# Patient Record
Sex: Male | Born: 1975 | Race: White | Hispanic: No | Marital: Married | Smoking: Never smoker
Health system: Southern US, Community
[De-identification: ages and names within clinical notes are randomized; demographics above are authoritative.]

## PROBLEM LIST (undated history)

## (undated) DIAGNOSIS — F419 Anxiety disorder, unspecified: Secondary | ICD-10-CM

## (undated) DIAGNOSIS — J45909 Unspecified asthma, uncomplicated: Secondary | ICD-10-CM

## (undated) DIAGNOSIS — K219 Gastro-esophageal reflux disease without esophagitis: Secondary | ICD-10-CM

## (undated) HISTORY — DX: Unspecified asthma, uncomplicated: J45.909

## (undated) HISTORY — DX: Gastro-esophageal reflux disease without esophagitis: K21.9

## (undated) HISTORY — PX: KNEE ARTHROCENTESIS: SUR44

---
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
## 1998-02-05 ENCOUNTER — Emergency Department (HOSPITAL_COMMUNITY): Admission: EM | Admit: 1998-02-05 | Discharge: 1998-02-06 | Payer: Self-pay | Admitting: Emergency Medicine

## 1999-02-06 ENCOUNTER — Encounter: Admission: RE | Admit: 1999-02-06 | Discharge: 1999-02-06 | Payer: Self-pay | Admitting: Orthopedic Surgery

## 1999-03-05 ENCOUNTER — Emergency Department (HOSPITAL_COMMUNITY): Admission: EM | Admit: 1999-03-05 | Discharge: 1999-03-05 | Payer: Self-pay | Admitting: Emergency Medicine

## 1999-03-05 ENCOUNTER — Encounter: Payer: Self-pay | Admitting: Emergency Medicine

## 2001-08-09 ENCOUNTER — Emergency Department (HOSPITAL_COMMUNITY): Admission: EM | Admit: 2001-08-09 | Discharge: 2001-08-09 | Payer: Self-pay | Admitting: Emergency Medicine

## 2001-10-27 ENCOUNTER — Ambulatory Visit (HOSPITAL_BASED_OUTPATIENT_CLINIC_OR_DEPARTMENT_OTHER): Admission: RE | Admit: 2001-10-27 | Discharge: 2001-10-28 | Payer: Self-pay | Admitting: Orthopedic Surgery

## 2003-04-24 HISTORY — PX: REPLACEMENT TOTAL KNEE: SUR1224

## 2004-04-11 ENCOUNTER — Ambulatory Visit (HOSPITAL_COMMUNITY): Admission: RE | Admit: 2004-04-11 | Discharge: 2004-04-11 | Payer: Self-pay | Admitting: Gastroenterology

## 2004-05-09 ENCOUNTER — Ambulatory Visit (HOSPITAL_COMMUNITY): Admission: RE | Admit: 2004-05-09 | Discharge: 2004-05-09 | Payer: Self-pay | Admitting: Gastroenterology

## 2004-08-09 ENCOUNTER — Inpatient Hospital Stay (HOSPITAL_COMMUNITY): Admission: RE | Admit: 2004-08-09 | Discharge: 2004-08-11 | Payer: Self-pay | Admitting: Surgery

## 2006-04-23 HISTORY — PX: NISSEN FUNDOPLICATION: SHX2091

## 2006-06-15 ENCOUNTER — Emergency Department (HOSPITAL_COMMUNITY): Admission: EM | Admit: 2006-06-15 | Discharge: 2006-06-16 | Payer: Self-pay | Admitting: Emergency Medicine

## 2006-06-21 ENCOUNTER — Ambulatory Visit (HOSPITAL_COMMUNITY): Admission: RE | Admit: 2006-06-21 | Discharge: 2006-06-21 | Payer: Self-pay | Admitting: Surgery

## 2006-07-10 ENCOUNTER — Ambulatory Visit: Payer: Self-pay | Admitting: Family Medicine

## 2009-03-30 ENCOUNTER — Encounter: Admission: RE | Admit: 2009-03-30 | Discharge: 2009-03-30 | Payer: Self-pay | Admitting: Surgery

## 2009-11-09 ENCOUNTER — Inpatient Hospital Stay (HOSPITAL_COMMUNITY): Admission: RE | Admit: 2009-11-09 | Discharge: 2009-11-11 | Payer: Self-pay | Admitting: Surgery

## 2010-02-20 ENCOUNTER — Ambulatory Visit (HOSPITAL_COMMUNITY): Admission: RE | Admit: 2010-02-20 | Discharge: 2010-02-20 | Payer: Self-pay | Admitting: Surgery

## 2010-03-24 ENCOUNTER — Encounter: Admission: RE | Admit: 2010-03-24 | Discharge: 2010-03-24 | Payer: Self-pay | Admitting: Otolaryngology

## 2010-05-14 ENCOUNTER — Encounter: Payer: Self-pay | Admitting: Surgery

## 2010-05-14 ENCOUNTER — Encounter: Payer: Self-pay | Admitting: Emergency Medicine

## 2010-07-08 LAB — CBC
HCT: 33.6 % — ABNORMAL LOW (ref 39.0–52.0)
HCT: 34.3 % — ABNORMAL LOW (ref 39.0–52.0)
Hemoglobin: 11 g/dL — ABNORMAL LOW (ref 13.0–17.0)
Hemoglobin: 11.2 g/dL — ABNORMAL LOW (ref 13.0–17.0)
MCH: 26 pg (ref 26.0–34.0)
MCHC: 32.8 g/dL (ref 30.0–36.0)
MCV: 79.2 fL (ref 78.0–100.0)
RBC: 4.24 MIL/uL (ref 4.22–5.81)
RDW: 16.4 % — ABNORMAL HIGH (ref 11.5–15.5)
WBC: 8.5 10*3/uL (ref 4.0–10.5)

## 2010-07-08 LAB — DIFFERENTIAL
Basophils Absolute: 0.1 10*3/uL (ref 0.0–0.1)
Basophils Relative: 1 % (ref 0–1)
Lymphs Abs: 2.1 10*3/uL (ref 0.7–4.0)
Monocytes Absolute: 1.6 10*3/uL — ABNORMAL HIGH (ref 0.1–1.0)
Monocytes Relative: 9 % (ref 3–12)
Neutrophils Relative %: 79 % — ABNORMAL HIGH (ref 43–77)

## 2010-07-09 LAB — SURGICAL PCR SCREEN
MRSA, PCR: NEGATIVE
Staphylococcus aureus: POSITIVE — AB

## 2010-08-31 ENCOUNTER — Other Ambulatory Visit: Payer: Self-pay | Admitting: Physical Medicine and Rehabilitation

## 2010-08-31 DIAGNOSIS — M545 Low back pain: Secondary | ICD-10-CM

## 2010-09-04 ENCOUNTER — Ambulatory Visit
Admission: RE | Admit: 2010-09-04 | Discharge: 2010-09-04 | Disposition: A | Discharge status: Home / Self Care | Payer: BC Managed Care – PPO | Source: Ambulatory Visit | Attending: Physical Medicine and Rehabilitation | Admitting: Physical Medicine and Rehabilitation

## 2010-09-04 DIAGNOSIS — M545 Low back pain: Secondary | ICD-10-CM

## 2010-09-08 NOTE — Op Note (Signed)
NAMEABDULAI, Chad Singh             ACCOUNT NO.:  [id number]   MEDICAL RECORD NO.:  [id number]          PATIENT TYPE:  INP   LOCATION:  0001                         FACILITY:  [facility]   PHYSICIAN:  Thornton Park. Daphine Deutscher, MD  DATE OF BIRTH:  [birth year]   DATE OF PROCEDURE:  08/09/2004  DATE OF DISCHARGE:                                 OPERATIVE REPORT   CCS:  #[id number].   PREOPERATIVE DIAGNOSES:  Multiple year history of gastroesophageal reflux  disease with severe erosive esophagitis.   POSTOPERATIVE DIAGNOSES:  Multiple year history of gastroesophageal reflux  disease with severe erosive esophagitis.   PROCEDURE:  Laparoscopic Nissen fundoplication over a #50 lighted bougie  with a two-suture closure of the hiatus using well-pledgeted suture and a  three-suture wrap.   SURGEON:  Thornton Park. Daphine Deutscher, M.D.   ASSISTANT:  Lebron Conners, M.D.   ANESTHESIA:  General anesthesia.   ESTIMATED BLOOD LOSS:  Minimal.   INDICATIONS FOR PROCEDURE:  Chad Singh is a 35 year old white male  who has had pretty much an adult life long history of severe  gastroesophageal reflux disease.  He has been scoped, on proton pump  inhibitors, and found to have severe erosive esophagitis by Dr. Fayrene Fearing L.  Edwards.   DESCRIPTION OF PROCEDURE:  He was brought to operating room #1 and given  general anesthesia.  The abdomen was prepped with Betadine and draped  sterilely.  I entered the abdomen using the OptiVu technique in the left  upper quadrant without difficulty.  The abdomen was then insufflated and a  10-11 was placed lateral to the left of the umbilicus and two 10-11's were  placed in the right upper quadrant.  I then surveyed the abdomen and placed  a 5 mm in the upper port for the squiggly articulating retractor, by which I  retracted the left lateral segment.   Dissection began by taking down the pars flaccida with the harmonic scalpel,  and then taking this up anteriorly, taking down the  peritoneal reflection  off the distal esophagus.  I then carried this down to the left side,  freeing up and identifying the left crease.  Then we posteriorly created a  window and were able to see the left crease as well as the right crease.  I  got beneath the posterior vagus and created a window.   I then went over and took down short gastrics with the harmonic scalpel and  then really nicely mobilized the cardia and a portion of the fundus.  I  could then reach around behind the esophagus through the window and pull the  stomach around easily.  With this arrangement, I went ahead and placed a  pledgeted suture posteriorly to approximate the crura, and then a second one  I placed and then tied after Dr. Hezzie Bump. Rose had passed the lighted #50  bougie.  This was tied and secured.   Next, I then pulled back the lighted bougie and went in and grasped the  posterior fundus and brought it around and created a nice invagination of  the distal esophagus with  this portion of the stomach.  The lighted bougie  was then passed back in, so that we were wrapping over the #50 bougie.  The  upper-most suture was a #2-0 silk placed with the free needle, getting  purchases of esophagus nicely, and then tying it with a tie knot.  With this  in good position, I then followed with two endo-stitch placed sutures with  intra-corporeal ties and clips placed on the knots for identification of the  wrap location.  The wrap looked good and healthy at the end.  No bleeding  was noted from the spleen, and I surveyed the other organs, then the bowel  and everything was intact.  The abdominal cavity was deflated and the port  sites were injected with 0.5% Marcaine.  The wounds were closed with #4-0  Vicryl subcutaneously and subcuticularly, along with Benzoin and Steri-  Strips.   The patient seemed to tolerate the procedure well and was taken to the  recovery room in satisfactory condition.      MBM/MEDQ   D:  08/09/2004  T:  08/09/2004  Job:  [id number]   cc:   Fayrene Fearing L. Malon Kindle., M.D.  [address]  [place]  [place] [address]  Fax: [phone]   Merrily Brittle, M.D.  [place], [place]
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
## 2010-09-08 NOTE — Op Note (Signed)
NAME:  SALMAAN, PATCHIN             ACCOUNT NO.:  [id number]   MEDICAL RECORD NO.:  [id number]          PATIENT TYPE:  AMB   LOCATION:  ENDO                         FACILITY:  [facility]   PHYSICIAN:  James L. Malon Kindle., M.D.DATE OF BIRTH:  July 03, 1975   DATE OF PROCEDURE:  04/11/2004  DATE OF DISCHARGE:                                 OPERATIVE REPORT   PROCEDURE:  Esophagogastroduodenoscopy.   MEDICATIONS:  Cetacaine spray, fentanyl 100 mcg, Versed 10 mg IV.   INDICATIONS FOR PROCEDURE:  Persistent esophageal reflux despite b.i.d.  Nexium.   DESCRIPTION OF PROCEDURE:  The procedure had been explained to the patient  and consent obtained.  With the patient in the left lateral decubitus  position, the Olympus scope was inserted and advanced.  The stomach was  entered, pylorus identified and passed.  The scope was withdrawn. The antrum  and body of the stomach were normal.  The scope was withdrawn and the  diaphragm was seen at 40 cm, at 35 cm was the Z line.  There was no gross  appearance but  marked ulcerative esophagitis, grade 3 or 4 ulcerative  esophagitis. The proximal esophagus was normal, there were no strictures.  The GE junction was widely patent.  The scope was withdrawn. The proximal  esophagus was normal.  The patient tolerated the procedure well.   ASSESSMENT:  1.  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 This is occuring despite the use of b.i.d. proton pump      inhibitor.  At this point in time, I think he ought to consider surgery.  2.  Esophageal reflux, 530.81.   PLAN:  Will keep her on the same medicines for now.  Will arrange an  outpatient manometry and will arrange a surgical consultation.      JLE/MEDQ  D:  04/11/2004  T:  04/11/2004  Job:  [id number]   cc:   Dr. Annabelle Harman __________, Rondall Allegra, [place]

## 2010-09-08 NOTE — Op Note (Signed)
NAME:  Chad Singh, Chad Singh             ACCOUNT NO.:  [id number]   MEDICAL RECORD NO.:  [id number]          PATIENT TYPE:  AMB   LOCATION:  ENDO                         FACILITY:  [facility]   PHYSICIAN:  James L. Malon Kindle., M.D.DATE OF BIRTH:  [birth year]   DATE OF PROCEDURE:  05/09/2004  DATE OF DISCHARGE:  05/09/2004                                 OPERATIVE REPORT   PROCEDURE:  Esophageal manometry.   INDICATIONS:  Patient with persistent esophageal reflux symptoms, is being  considered for antireflux procedure.   DESCRIPTION OF PROCEDURE:  The procedure had been explained to the patient  and consent obtained.  The patient had esophageal manometry performed in the  usual manner.  The results are as follows:   1.  Upper esophageal sphincter:  Very poorly-seen.  Does not appear to have      pharyngeal spikes.  2.  Esophageal body:  Decreased pressure throughout with normal peristalsis      in most swallows.  Mean pressure of the distal esophagus is 16 mm, and      all of the pressures measured throughout the entire esophagus were      normal than the normal range, but again the peristalsis appeared normal      in most swallows.  3.  Lower esophageal sphincter:  Mean pressure 4.4 mm.  On the tracings I      really could not even determine where the LES was.  There was 100%      relaxation.   ASSESSMENT:  Hypotensive lower esophageal sphincter consistent with the  patient's history of severe esophageal reflux.   PLAN:  The patient is due to see Molli Hazard B. Daphine Deutscher, M.D., in the very near  future for consideration for an antireflux operation.      JLE/MEDQ  D:  05/22/2004  T:  05/22/2004  Job:  [id number]   cc:   Brandt Loosen, M.D.  [place], [place] B. Daphine Deutscher, MD  [address], [address]  [place]  [place] [address]

## 2012-09-03 ENCOUNTER — Telehealth (INDEPENDENT_AMBULATORY_CARE_PROVIDER_SITE_OTHER): Payer: Self-pay | Admitting: General Surgery
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
## 2013-10-30 ENCOUNTER — Encounter (INDEPENDENT_AMBULATORY_CARE_PROVIDER_SITE_OTHER): Payer: BC Managed Care – PPO | Admitting: Surgery

## 2013-11-03 ENCOUNTER — Ambulatory Visit (INDEPENDENT_AMBULATORY_CARE_PROVIDER_SITE_OTHER): Payer: Self-pay | Admitting: Surgery

## 2013-11-03 ENCOUNTER — Encounter (INDEPENDENT_AMBULATORY_CARE_PROVIDER_SITE_OTHER): Payer: Self-pay | Admitting: Surgery

## 2013-11-03 VITALS — BP 128/82 | HR 84 | Temp 97.2°F | Resp 16 | Ht 72.0 in | Wt 203.0 lb

## 2013-11-03 DIAGNOSIS — R0789 Other chest pain: Secondary | ICD-10-CM

## 2013-11-03 DIAGNOSIS — R071 Chest pain on breathing: Secondary | ICD-10-CM

## 2013-11-03 NOTE — Patient Instructions (Signed)
Thanks for your patience.  If you need further assistance after leaving the office, please call our office and speak with a [facility] nurse.  [phone].  If you want to leave a message for Dr. Larah Kuntzman, please call his office phone at [phone]. 

## 2013-11-03 NOTE — Progress Notes (Signed)
Chad Singh 37 y.o.  Body mass index is 27.53 kg/(m^2).  There are no active problems to display for this patient.   No Known Allergies    Past Surgical History  Procedure Laterality Date  . Knee arthrocentesis    . Nissen fundoplication  2008   No primary provider on file. 1. Chest wall pain     Chad Singh comes in today to assess chest pain after straining.  He describes trying to pull out a stuck spool and did so by placing his foot up on his truck and grabbing the spool when pulling back with 2 arms. When he did that he experienced sharp pain in the left chest with swelling anteriorly. He has not experienced any increase and he can of GI symptoms. The tenderness and then the left anterior chest wall where the rectus muscles insert on the rib cage. His swallowing is unchanged he really doesn't have much in the way of heartburn symptoms. He watches what he eats and is able to control things.  On physical exam he was most tender over the left anterior chest. He does have a prominent xiphoid and perhaps a little bit of  pectus excavatum.  I reassured him to watch how he lists and strains or he will aggravate his chest wall injury. I do not think he has had any diaphragmatic injury that will adversely effect his underlying Nissen fundoplication and hiatal hernia repair. Matt B. Daphine DeutscherMartin, MD, [facility]  [facility] [phone] beeper [phone]  11/03/2013 2:07 PM

## 2013-11-13 ENCOUNTER — Other Ambulatory Visit (INDEPENDENT_AMBULATORY_CARE_PROVIDER_SITE_OTHER): Payer: Self-pay | Admitting: Surgery

## 2013-11-13 DIAGNOSIS — J329 Chronic sinusitis, unspecified: Secondary | ICD-10-CM

## 2013-11-13 MED ORDER — LEVOFLOXACIN 500 MG PO TABS: 500.0000 mg | ORAL_TABLET | Freq: Every day | ORAL | Status: AC

## 2013-11-13 NOTE — Telephone Encounter (Signed)
[name] is out on a job and having severe pain with recurrent maxillary sinusitis.  Will begin Levaquin, Afrin, and Sudafed.

## 2017-07-10 ENCOUNTER — Encounter: Payer: Self-pay | Admitting: Family Medicine

## 2017-07-10 ENCOUNTER — Ambulatory Visit (INDEPENDENT_AMBULATORY_CARE_PROVIDER_SITE_OTHER): Payer: BLUE CROSS/BLUE SHIELD | Admitting: Family Medicine

## 2017-07-10 VITALS — BP 130/106 | HR 97 | Ht 72.0 in | Wt 205.0 lb

## 2017-07-10 DIAGNOSIS — Z0000 Encounter for general adult medical examination without abnormal findings: Secondary | ICD-10-CM | POA: Diagnosis not present

## 2017-07-10 DIAGNOSIS — R030 Elevated blood-pressure reading, without diagnosis of hypertension: Secondary | ICD-10-CM | POA: Insufficient documentation

## 2017-07-10 DIAGNOSIS — J309 Allergic rhinitis, unspecified: Secondary | ICD-10-CM | POA: Diagnosis not present

## 2017-07-10 DIAGNOSIS — K219 Gastro-esophageal reflux disease without esophagitis: Secondary | ICD-10-CM | POA: Diagnosis not present

## 2017-07-10 DIAGNOSIS — F419 Anxiety disorder, unspecified: Secondary | ICD-10-CM | POA: Diagnosis not present

## 2017-07-10 LAB — URINALYSIS, ROUTINE W REFLEX MICROSCOPIC
Bilirubin Urine: NEGATIVE
HGB URINE DIPSTICK: NEGATIVE
Ketones, ur: NEGATIVE
Leukocytes, UA: NEGATIVE
Nitrite: NEGATIVE
Specific Gravity, Urine: 1.025 (ref 1.000–1.030)
Total Protein, Urine: NEGATIVE
URINE GLUCOSE: NEGATIVE
UROBILINOGEN UA: 0.2 (ref 0.0–1.0)
pH: 6 (ref 5.0–8.0)

## 2017-07-10 LAB — COMPREHENSIVE METABOLIC PANEL
AG Ratio: 1.6 (calc) (ref 1.0–2.5)
ALKALINE PHOSPHATASE (APISO): 51 U/L (ref 40–115)
ALT: 27 U/L (ref 9–46)
AST: 20 U/L (ref 10–40)
Albumin: 4.5 g/dL (ref 3.6–5.1)
BUN: 10 mg/dL (ref 7–25)
CHLORIDE: 106 mmol/L (ref 98–110)
CO2: 23 mmol/L (ref 20–32)
CREATININE: 1.17 mg/dL (ref 0.60–1.35)
Calcium: 9.7 mg/dL (ref 8.6–10.3)
GLOBULIN: 2.9 g/dL (ref 1.9–3.7)
Glucose, Bld: 109 mg/dL — ABNORMAL HIGH (ref 65–99)
Potassium: 4.2 mmol/L (ref 3.5–5.3)
SODIUM: 142 mmol/L (ref 135–146)
TOTAL PROTEIN: 7.4 g/dL (ref 6.1–8.1)
Total Bilirubin: 0.6 mg/dL (ref 0.2–1.2)

## 2017-07-10 LAB — TSH: TSH: 1.18 u[IU]/mL (ref 0.35–4.50)

## 2017-07-10 LAB — CBC
HCT: 45.1 % (ref 39.0–52.0)
Hemoglobin: 15.4 g/dL (ref 13.0–17.0)
MCHC: 34.1 g/dL (ref 30.0–36.0)
MCV: 90.5 fl (ref 78.0–100.0)
PLATELETS: 306 10*3/uL (ref 150.0–400.0)
RBC: 4.99 Mil/uL (ref 4.22–5.81)
RDW: 13.8 % (ref 11.5–15.5)
WBC: 10.6 10*3/uL — AB (ref 4.0–10.5)

## 2017-07-10 NOTE — Patient Instructions (Addendum)
DASH Eating Plan DASH stands for "Dietary Approaches to Stop Hypertension." The DASH eating plan is a healthy eating plan that has been shown to reduce high blood pressure (hypertension). It may also reduce your risk for type 2 diabetes, heart disease, and stroke. The DASH eating plan may also help with weight loss. What are tips for following this plan? General guidelines  Avoid eating more than 2,300 mg (milligrams) of salt (sodium) a day. If you have hypertension, you may need to reduce your sodium intake to 1,500 mg a day.  Limit alcohol intake to no more than 1 drink a day for nonpregnant women and 2 drinks a day for men. One drink equals 12 oz of beer, 5 oz of wine, or 1 oz of hard liquor.  Work with your health care provider to maintain a healthy body weight or to lose weight. Ask what an ideal weight is for you.  Get at least 30 minutes of exercise that causes your heart to beat faster (aerobic exercise) most days of the week. Activities may include walking, swimming, or biking.  Work with your health care provider or diet and nutrition specialist (dietitian) to adjust your eating plan to your individual calorie needs. Reading food labels  Check food labels for the amount of sodium per serving. Choose foods with less than 5 percent of the Daily Value of sodium. Generally, foods with less than 300 mg of sodium per serving fit into this eating plan.  To find whole grains, look for the word "whole" as the first word in the ingredient list. Shopping  Buy products labeled as "low-sodium" or "no salt added."  Buy fresh foods. Avoid canned foods and premade or frozen meals. Cooking  Avoid adding salt when cooking. Use salt-free seasonings or herbs instead of table salt or sea salt. Check with your health care provider or pharmacist before using salt substitutes.  Do not fry foods. Cook foods using healthy methods such as baking, boiling, grilling, and broiling instead.  Cook with  heart-healthy oils, such as olive, canola, soybean, or sunflower oil. Meal planning   Eat a balanced diet that includes: ? 5 or more servings of fruits and vegetables each day. At each meal, try to fill half of your plate with fruits and vegetables. ? Up to 6-8 servings of whole grains each day. ? Less than 6 oz of lean meat, poultry, or fish each day. A 3-oz serving of meat is about the same size as a deck of cards. One egg equals 1 oz. ? 2 servings of low-fat dairy each day. ? A serving of nuts, seeds, or beans 5 times each week. ? Heart-healthy fats. Healthy fats called Omega-3 fatty acids are found in foods such as flaxseeds and coldwater fish, like sardines, salmon, and mackerel.  Limit how much you eat of the following: ? Canned or prepackaged foods. ? Food that is high in trans fat, such as fried foods. ? Food that is high in saturated fat, such as fatty meat. ? Sweets, desserts, sugary drinks, and other foods with added sugar. ? Full-fat dairy products.  Do not salt foods before eating.  Try to eat at least 2 vegetarian meals each week.  Eat more home-cooked food and less restaurant, buffet, and fast food.  When eating at a restaurant, ask that your food be prepared with less salt or no salt, if possible. What foods are recommended? The items listed may not be a complete list. Talk with your dietitian about what   dietary choices are best for you. Grains Whole-grain or whole-wheat bread. Whole-grain or whole-wheat pasta. Brown rice. Oatmeal. Quinoa. Bulgur. Whole-grain and low-sodium cereals. Pita bread. Low-fat, low-sodium crackers. Whole-wheat flour tortillas. Vegetables Fresh or frozen vegetables (raw, steamed, roasted, or grilled). Low-sodium or reduced-sodium tomato and vegetable juice. Low-sodium or reduced-sodium tomato sauce and tomato paste. Low-sodium or reduced-sodium canned vegetables. Fruits All fresh, dried, or frozen fruit. Canned fruit in natural juice (without  added sugar). Meat and other protein foods Skinless chicken or turkey. Ground chicken or turkey. Pork with fat trimmed off. Fish and seafood. Egg whites. Dried beans, peas, or lentils. Unsalted nuts, nut butters, and seeds. Unsalted canned beans. Lean cuts of beef with fat trimmed off. Low-sodium, lean deli meat. Dairy Low-fat (1%) or fat-free (skim) milk. Fat-free, low-fat, or reduced-fat cheeses. Nonfat, low-sodium ricotta or cottage cheese. Low-fat or nonfat yogurt. Low-fat, low-sodium cheese. Fats and oils Soft margarine without trans fats. Vegetable oil. Low-fat, reduced-fat, or light mayonnaise and salad dressings (reduced-sodium). Canola, safflower, olive, soybean, and sunflower oils. Avocado. Seasoning and other foods Herbs. Spices. Seasoning mixes without salt. Unsalted popcorn and pretzels. Fat-free sweets. What foods are not recommended? The items listed may not be a complete list. Talk with your dietitian about what dietary choices are best for you. Grains Baked goods made with fat, such as croissants, muffins, or some breads. Dry pasta or rice meal packs. Vegetables Creamed or fried vegetables. Vegetables in a cheese sauce. Regular canned vegetables (not low-sodium or reduced-sodium). Regular canned tomato sauce and paste (not low-sodium or reduced-sodium). Regular tomato and vegetable juice (not low-sodium or reduced-sodium). Pickles. Olives. Fruits Canned fruit in a light or heavy syrup. Fried fruit. Fruit in cream or butter sauce. Meat and other protein foods Fatty cuts of meat. Ribs. Fried meat. Bacon. Sausage. Bologna and other processed lunch meats. Salami. Fatback. Hotdogs. Bratwurst. Salted nuts and seeds. Canned beans with added salt. Canned or smoked fish. Whole eggs or egg yolks. Chicken or turkey with skin. Dairy Whole or 2% milk, cream, and half-and-half. Whole or full-fat cream cheese. Whole-fat or sweetened yogurt. Full-fat cheese. Nondairy creamers. Whipped toppings.  Processed cheese and cheese spreads. Fats and oils Butter. Stick margarine. Lard. Shortening. Ghee. Bacon fat. Tropical oils, such as coconut, palm kernel, or palm oil. Seasoning and other foods Salted popcorn and pretzels. Onion salt, garlic salt, seasoned salt, table salt, and sea salt. Worcestershire sauce. Tartar sauce. Barbecue sauce. Teriyaki sauce. Soy sauce, including reduced-sodium. Steak sauce. Canned and packaged gravies. Fish sauce. Oyster sauce. Cocktail sauce. Horseradish that you find on the shelf. Ketchup. Mustard. Meat flavorings and tenderizers. Bouillon cubes. Hot sauce and Tabasco sauce. Premade or packaged marinades. Premade or packaged taco seasonings. Relishes. Regular salad dressings. Where to find more information:  National Heart, Lung, and Blood Institute: www.nhlbi.nih.gov  American Heart Association: www.heart.org Summary  The DASH eating plan is a healthy eating plan that has been shown to reduce high blood pressure (hypertension). It may also reduce your risk for type 2 diabetes, heart disease, and stroke.  With the DASH eating plan, you should limit salt (sodium) intake to 2,300 mg a day. If you have hypertension, you may need to reduce your sodium intake to 1,500 mg a day.  When on the DASH eating plan, aim to eat more fresh fruits and vegetables, whole grains, lean proteins, low-fat dairy, and heart-healthy fats.  Work with your health care provider or diet and nutrition specialist (dietitian) to adjust your eating plan to your individual   calorie needs. This information is not intended to replace advice given to you by your health care provider. Make sure you discuss any questions you have with your health care provider. Document Released: 03/29/2011 Document Revised: 04/02/2016 Document Reviewed: 04/02/2016 Elsevier Interactive Patient Education  2018 [employer]  Hypertension Hypertension, commonly called high blood pressure, is when the force of blood  pumping through the arteries is too strong. The arteries are the blood vessels that carry blood from the heart throughout the body. Hypertension forces the heart to work harder to pump blood and may cause arteries to become narrow or stiff. Having untreated or uncontrolled hypertension can cause heart attacks, strokes, kidney disease, and other problems. A blood pressure reading consists of a higher number over a lower number. Ideally, your blood pressure should be below 120/80. The first ("top") number is called the systolic pressure. It is a measure of the pressure in your arteries as your heart beats. The second ("bottom") number is called the diastolic pressure. It is a measure of the pressure in your arteries as the heart relaxes. What are the causes? The cause of this condition is not known. What increases the risk? Some risk factors for high blood pressure are under your control. Others are not. Factors you can change  Smoking.  Having type 2 diabetes mellitus, high cholesterol, or both.  Not getting enough exercise or physical activity.  Being overweight.  Having too much fat, sugar, calories, or salt (sodium) in your diet.  Drinking too much alcohol. Factors that are difficult or impossible to change  Having chronic kidney disease.  Having a family history of high blood pressure.  Age. Risk increases with age.  Race. You may be at higher risk if you are African-American.  Gender. Men are at higher risk than women before age 45. After age 65, women are at higher risk than men.  Having obstructive sleep apnea.  Stress. What are the signs or symptoms? Extremely high blood pressure (hypertensive crisis) may cause:  Headache.  Anxiety.  Shortness of breath.  Nosebleed.  Nausea and vomiting.  Severe chest pain.  Jerky movements you cannot control (seizures).  How is this diagnosed? This condition is diagnosed by measuring your blood pressure while you are  seated, with your arm resting on a surface. The cuff of the blood pressure monitor will be placed directly against the skin of your upper arm at the level of your heart. It should be measured at least twice using the same arm. Certain conditions can cause a difference in blood pressure between your right and left arms. Certain factors can cause blood pressure readings to be lower or higher than normal (elevated) for a short period of time:  When your blood pressure is higher when you are in a health care provider's office than when you are at home, this is called white coat hypertension. Most people with this condition do not need medicines.  When your blood pressure is higher at home than when you are in a health care provider's office, this is called masked hypertension. Most people with this condition may need medicines to control blood pressure.  If you have a high blood pressure reading during one visit or you have normal blood pressure with other risk factors:  You may be asked to return on a different day to have your blood pressure checked again.  You may be asked to monitor your blood pressure at home for 1 week or longer.  If you are   diagnosed with hypertension, you may have other blood or imaging tests to help your health care provider understand your overall risk for other conditions. How is this treated? This condition is treated by making healthy lifestyle changes, such as eating healthy foods, exercising more, and reducing your alcohol intake. Your health care provider may prescribe medicine if lifestyle changes are not enough to get your blood pressure under control, and if:  Your systolic blood pressure is above 130.  Your diastolic blood pressure is above 80.  Your personal target blood pressure may vary depending on your medical conditions, your age, and other factors. Follow these instructions at home: Eating and drinking  Eat a diet that is high in fiber and potassium,  and low in sodium, added sugar, and fat. An example eating plan is called the DASH (Dietary Approaches to Stop Hypertension) diet. To eat this way: ? Eat plenty of fresh fruits and vegetables. Try to fill half of your plate at each meal with fruits and vegetables. ? Eat whole grains, such as whole wheat pasta, brown rice, or whole grain bread. Fill about one quarter of your plate with whole grains. ? Eat or drink low-fat dairy products, such as skim milk or low-fat yogurt. ? Avoid fatty cuts of meat, processed or cured meats, and poultry with skin. Fill about one quarter of your plate with lean proteins, such as fish, chicken without skin, beans, eggs, and tofu. ? Avoid premade and processed foods. These tend to be higher in sodium, added sugar, and fat.  Reduce your daily sodium intake. Most people with hypertension should eat less than 1,500 mg of sodium a day.  Limit alcohol intake to no more than 1 drink a day for nonpregnant women and 2 drinks a day for men. One drink equals 12 oz of beer, 5 oz of wine, or 1 oz of hard liquor. Lifestyle  Work with your health care provider to maintain a healthy body weight or to lose weight. Ask what an ideal weight is for you.  Get at least 30 minutes of exercise that causes your heart to beat faster (aerobic exercise) most days of the week. Activities may include walking, swimming, or biking.  Include exercise to strengthen your muscles (resistance exercise), such as pilates or lifting weights, as part of your weekly exercise routine. Try to do these types of exercises for 30 minutes at least 3 days a week.  Do not use any products that contain nicotine or tobacco, such as cigarettes and e-cigarettes. If you need help quitting, ask your health care provider.  Monitor your blood pressure at home as told by your health care provider.  Keep all follow-up visits as told by your health care provider. This is important. Medicines  Take over-the-counter and  prescription medicines only as told by your health care provider. Follow directions carefully. Blood pressure medicines must be taken as prescribed.  Do not skip doses of blood pressure medicine. Doing this puts you at risk for problems and can make the medicine less effective.  Ask your health care provider about side effects or reactions to medicines that you should watch for. Contact a health care provider if:  You think you are having a reaction to a medicine you are taking.  You have headaches that keep coming back (recurring).  You feel dizzy.  You have swelling in your ankles.  You have trouble with your vision. Get help right away if:  You develop a severe headache or confusion.    You have unusual weakness or numbness.  You feel faint.  You have severe pain in your chest or abdomen.  You vomit repeatedly.  You have trouble breathing. Summary  Hypertension is when the force of blood pumping through your arteries is too strong. If this condition is not controlled, it may put you at risk for serious complications.  Your personal target blood pressure may vary depending on your medical conditions, your age, and other factors. For most people, a normal blood pressure is less than 120/80.  Hypertension is treated with lifestyle changes, medicines, or a combination of both. Lifestyle changes include weight loss, eating a healthy, low-sodium diet, exercising more, and limiting alcohol. This information is not intended to replace advice given to you by your health care provider. Make sure you discuss any questions you have with your health care provider. Document Released: 04/09/2005 Document Revised: 03/07/2016 Document Reviewed: 03/07/2016 Elsevier Interactive Patient Education  2018 [employer] Inc.  

## 2017-07-10 NOTE — Progress Notes (Signed)
Subjective:  Patient ID: Chad RossettiRobert D Singh, male    DOB: 1975-09-19  Age: 42 y.o. MRN: [id number]  CC: Establish Care   HPI Chad RossettiRobert D Withington presents for establishment.  He is by his wife.  He is fasting today.  He has a significant past medical history of allergy rhinitis treated with Singulair.  He is a history of of GERD that has been treated surgically with a Nissen fundoplication.  Past medical history also includes anxiety.  He has been followed for this at a psychiatric practice for some time now and has decided to seek his psychiatric care elsewhere.  His blood pressure has elevated over the last year or 2.  It is never been treated.  He does not smoke or use illicit drugs.  He consumes some alcohol on the weekends but denies daily use.  His wife concurs.  They have 2 sons ages 6910 and 65.  Sons are involved with soccer.  His parents are both in their 960s and dad has high blood pressure.  Mom does not have high blood pressure as far as they know.   History Chad MaduroRobert has a past medical history of Asthma and GERD (gastroesophageal reflux disease).   He has a past surgical history that includes Knee arthrocentesis and Nissen fundoplication (2008).   His family history is not on file.He reports that  has never smoked. he has never used smokeless tobacco. He reports that he drinks alcohol. He reports that he does not use drugs.  Outpatient Medications Prior to Visit  Medication Sig Dispense Refill  . cetirizine (ZYRTEC) 10 MG tablet Take 10 mg by mouth.    . montelukast (SINGULAIR) 10 MG tablet TAKE 1 TABLET BY MOUTH DAILY     No facility-administered medications prior to visit.     ROS Review of Systems  Constitutional: Negative for chills, fatigue, fever and unexpected weight change.  HENT: Negative.   Eyes: Negative.   Respiratory: Negative.   Cardiovascular: Negative.   Genitourinary: Negative.   Musculoskeletal: Negative for gait problem and myalgias.  Skin: Negative.     Neurological: Negative for weakness and headaches.  Hematological: Does not bruise/bleed easily.  Psychiatric/Behavioral: The patient is nervous/anxious.     Objective:  BP (!) 130/106 (BP Location: Left Arm, Patient Position: Sitting, Cuff Size: Normal)   Pulse 97   Ht 6' (1.829 m)   Wt 205 lb (93 kg)   BMI 27.80 kg/m   Physical Exam  Constitutional: He is oriented to person, place, and time. He appears well-developed and well-nourished. No distress.  HENT:  Head: Normocephalic and atraumatic.  Right Ear: External ear normal.  Left Ear: External ear normal.  Mouth/Throat: Oropharynx is clear and moist. No oropharyngeal exudate.  Eyes: Pupils are equal, round, and reactive to light. Right eye exhibits no discharge. Left eye exhibits no discharge. No scleral icterus.  Neck: Neck supple. No JVD present. No tracheal deviation present. No thyromegaly present.  Cardiovascular: Normal rate, regular rhythm and normal heart sounds.  Pulmonary/Chest: Effort normal and breath sounds normal. No stridor.  Abdominal: Soft. Bowel sounds are normal. He exhibits no distension and no mass. There is no tenderness. There is no rebound and no guarding. Hernia confirmed negative in the right inguinal area and confirmed negative in the left inguinal area.  Genitourinary: Penis normal. Right testis shows no mass, no swelling and no tenderness. Right testis is descended. Left testis shows no mass, no swelling and no tenderness. Left testis is descended. No  phimosis, paraphimosis, hypospadias, penile erythema or penile tenderness. No discharge found.  Lymphadenopathy:    He has no cervical adenopathy.       Right: No inguinal adenopathy present.       Left: No inguinal adenopathy present.  Neurological: He is alert and oriented to person, place, and time.  Skin: Skin is warm and dry. He is not diaphoretic.  Psychiatric: He has a normal mood and affect. His behavior is normal.      Assessment & Plan:    [place] was seen today for establish care.  Diagnoses and all orders for this visit:  Elevated BP without diagnosis of hypertension -     CBC -     Comprehensive metabolic panel -     TSH -     Urinalysis, Routine w reflex microscopic  Health care maintenance -     CBC -     Comprehensive metabolic panel -     Lipid panel -     TSH -     Urinalysis, Routine w reflex microscopic  Allergic rhinitis, unspecified seasonality, unspecified trigger  Gastroesophageal reflux disease, esophagitis presence not specified  Anxiety -     Ambulatory referral to Psychiatry   I am having Barbette Hair. Elting maintain his cetirizine and montelukast.  No orders of the defined types were placed in this encounter.  His wife will check and record his blood pressure over the next month.  He will RTC after that period of time and will make a decision about his blood pressure were whether or not it needs treatment.  Follow-up: Return in about 1 month (around 08/10/2017).  Mliss Sax, MD

## 2017-07-11 ENCOUNTER — Encounter: Payer: Self-pay | Admitting: Family Medicine

## 2017-07-11 LAB — LIPID PANEL
CHOL/HDL RATIO: 3
Cholesterol: 185 mg/dL (ref 0–200)
HDL: 55 mg/dL (ref >39.00)
LDL CALC: 108 mg/dL — AB (ref 0–99)
NonHDL: 130.27
Triglycerides: 111 mg/dL (ref 0.0–149.0)
VLDL: 22.2 mg/dL (ref 0.0–40.0)

## 2017-07-18 ENCOUNTER — Ambulatory Visit (INDEPENDENT_AMBULATORY_CARE_PROVIDER_SITE_OTHER): Payer: PRIVATE HEALTH INSURANCE | Admitting: Psychiatry

## 2017-07-18 ENCOUNTER — Encounter (HOSPITAL_COMMUNITY): Payer: Self-pay | Admitting: Psychiatry

## 2017-07-18 VITALS — BP 142/80 | HR 72 | Ht 72.0 in | Wt 203.8 lb

## 2017-07-18 DIAGNOSIS — F900 Attention-deficit hyperactivity disorder, predominantly inattentive type: Secondary | ICD-10-CM | POA: Diagnosis not present

## 2017-07-18 DIAGNOSIS — Z818 Family history of other mental and behavioral disorders: Secondary | ICD-10-CM | POA: Diagnosis not present

## 2017-07-18 DIAGNOSIS — F411 Generalized anxiety disorder: Secondary | ICD-10-CM | POA: Diagnosis not present

## 2017-07-18 MED ORDER — LAMOTRIGINE 25 MG PO TABS: ORAL_TABLET | ORAL | 0 refills | Status: DC

## 2017-07-18 MED ORDER — SERTRALINE HCL 50 MG PO TABS: ORAL_TABLET | ORAL | 0 refills | Status: DC

## 2017-07-18 MED ORDER — LISDEXAMFETAMINE DIMESYLATE 30 MG PO CAPS: 30.0000 mg | ORAL_CAPSULE | Freq: Every day | ORAL | 0 refills | Status: DC
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
## 2017-08-10 ENCOUNTER — Other Ambulatory Visit (HOSPITAL_COMMUNITY): Payer: Self-pay | Admitting: Psychiatry

## 2017-08-10 DIAGNOSIS — F411 Generalized anxiety disorder: Secondary | ICD-10-CM

## 2017-08-12 ENCOUNTER — Ambulatory Visit (INDEPENDENT_AMBULATORY_CARE_PROVIDER_SITE_OTHER): Payer: BLUE CROSS/BLUE SHIELD | Admitting: Family Medicine

## 2017-08-12 ENCOUNTER — Encounter: Payer: Self-pay | Admitting: Family Medicine

## 2017-08-12 VITALS — BP 142/90 | HR 109 | Ht 72.0 in | Wt 201.4 lb

## 2017-08-12 DIAGNOSIS — I10 Essential (primary) hypertension: Secondary | ICD-10-CM | POA: Insufficient documentation

## 2017-08-12 MED ORDER — DILTIAZEM HCL ER 120 MG PO CP24: 120.0000 mg | ORAL_CAPSULE | Freq: Every day | ORAL | 1 refills | Status: DC
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
## 2017-08-12 NOTE — Patient Instructions (Signed)
DASH Eating Plan DASH stands for "Dietary Approaches to Stop Hypertension." The DASH eating plan is a healthy eating plan that has been shown to reduce high blood pressure (hypertension). It may also reduce your risk for type 2 diabetes, heart disease, and stroke. The DASH eating plan may also help with weight loss. What are tips for following this plan? General guidelines  Avoid eating more than 2,300 mg (milligrams) of salt (sodium) a day. If you have hypertension, you may need to reduce your sodium intake to 1,500 mg a day.  Limit alcohol intake to no more than 1 drink a day for nonpregnant women and 2 drinks a day for men. One drink equals 12 oz of beer, 5 oz of wine, or 1 oz of hard liquor.  Work with your health care provider to maintain a healthy body weight or to lose weight. Ask what an ideal weight is for you.  Get at least 30 minutes of exercise that causes your heart to beat faster (aerobic exercise) most days of the week. Activities may include walking, swimming, or biking.  Work with your health care provider or diet and nutrition specialist (dietitian) to adjust your eating plan to your individual calorie needs. Reading food labels  Check food labels for the amount of sodium per serving. Choose foods with less than 5 percent of the Daily Value of sodium. Generally, foods with less than 300 mg of sodium per serving fit into this eating plan.  To find whole grains, look for the word "whole" as the first word in the ingredient list. Shopping  Buy products labeled as "low-sodium" or "no salt added."  Buy fresh foods. Avoid canned foods and premade or frozen meals. Cooking  Avoid adding salt when cooking. Use salt-free seasonings or herbs instead of table salt or sea salt. Check with your health care provider or pharmacist before using salt substitutes.  Do not fry foods. Cook foods using healthy methods such as baking, boiling, grilling, and broiling instead.  Cook with  heart-healthy oils, such as olive, canola, soybean, or sunflower oil. Meal planning   Eat a balanced diet that includes: ? 5 or more servings of fruits and vegetables each day. At each meal, try to fill half of your plate with fruits and vegetables. ? Up to 6-8 servings of whole grains each day. ? Less than 6 oz of lean meat, poultry, or fish each day. A 3-oz serving of meat is about the same size as a deck of cards. One egg equals 1 oz. ? 2 servings of low-fat dairy each day. ? A serving of nuts, seeds, or beans 5 times each week. ? Heart-healthy fats. Healthy fats called Omega-3 fatty acids are found in foods such as flaxseeds and coldwater fish, like sardines, salmon, and mackerel.  Limit how much you eat of the following: ? Canned or prepackaged foods. ? Food that is high in trans fat, such as fried foods. ? Food that is high in saturated fat, such as fatty meat. ? Sweets, desserts, sugary drinks, and other foods with added sugar. ? Full-fat dairy products.  Do not salt foods before eating.  Try to eat at least 2 vegetarian meals each week.  Eat more home-cooked food and less restaurant, buffet, and fast food.  When eating at a restaurant, ask that your food be prepared with less salt or no salt, if possible. What foods are recommended? The items listed may not be a complete list. Talk with your dietitian about what   dietary choices are best for you. Grains Whole-grain or whole-wheat bread. Whole-grain or whole-wheat pasta. Brown rice. Oatmeal. Quinoa. Bulgur. Whole-grain and low-sodium cereals. Pita bread. Low-fat, low-sodium crackers. Whole-wheat flour tortillas. Vegetables Fresh or frozen vegetables (raw, steamed, roasted, or grilled). Low-sodium or reduced-sodium tomato and vegetable juice. Low-sodium or reduced-sodium tomato sauce and tomato paste. Low-sodium or reduced-sodium canned vegetables. Fruits All fresh, dried, or frozen fruit. Canned fruit in natural juice (without  added sugar). Meat and other protein foods Skinless chicken or turkey. Ground chicken or turkey. Pork with fat trimmed off. Fish and seafood. Egg whites. Dried beans, peas, or lentils. Unsalted nuts, nut butters, and seeds. Unsalted canned beans. Lean cuts of beef with fat trimmed off. Low-sodium, lean deli meat. Dairy Low-fat (1%) or fat-free (skim) milk. Fat-free, low-fat, or reduced-fat cheeses. Nonfat, low-sodium ricotta or cottage cheese. Low-fat or nonfat yogurt. Low-fat, low-sodium cheese. Fats and oils Soft margarine without trans fats. Vegetable oil. Low-fat, reduced-fat, or light mayonnaise and salad dressings (reduced-sodium). Canola, safflower, olive, soybean, and sunflower oils. Avocado. Seasoning and other foods Herbs. Spices. Seasoning mixes without salt. Unsalted popcorn and pretzels. Fat-free sweets. What foods are not recommended? The items listed may not be a complete list. Talk with your dietitian about what dietary choices are best for you. Grains Baked goods made with fat, such as croissants, muffins, or some breads. Dry pasta or rice meal packs. Vegetables Creamed or fried vegetables. Vegetables in a cheese sauce. Regular canned vegetables (not low-sodium or reduced-sodium). Regular canned tomato sauce and paste (not low-sodium or reduced-sodium). Regular tomato and vegetable juice (not low-sodium or reduced-sodium). Pickles. Olives. Fruits Canned fruit in a light or heavy syrup. Fried fruit. Fruit in cream or butter sauce. Meat and other protein foods Fatty cuts of meat. Ribs. Fried meat. Bacon. Sausage. Bologna and other processed lunch meats. Salami. Fatback. Hotdogs. Bratwurst. Salted nuts and seeds. Canned beans with added salt. Canned or smoked fish. Whole eggs or egg yolks. Chicken or turkey with skin. Dairy Whole or 2% milk, cream, and half-and-half. Whole or full-fat cream cheese. Whole-fat or sweetened yogurt. Full-fat cheese. Nondairy creamers. Whipped toppings.  Processed cheese and cheese spreads. Fats and oils Butter. Stick margarine. Lard. Shortening. Ghee. Bacon fat. Tropical oils, such as coconut, palm kernel, or palm oil. Seasoning and other foods Salted popcorn and pretzels. Onion salt, garlic salt, seasoned salt, table salt, and sea salt. Worcestershire sauce. Tartar sauce. Barbecue sauce. Teriyaki sauce. Soy sauce, including reduced-sodium. Steak sauce. Canned and packaged gravies. Fish sauce. Oyster sauce. Cocktail sauce. Horseradish that you find on the shelf. Ketchup. Mustard. Meat flavorings and tenderizers. Bouillon cubes. Hot sauce and Tabasco sauce. Premade or packaged marinades. Premade or packaged taco seasonings. Relishes. Regular salad dressings. Where to find more information:  National Heart, Lung, and Blood Institute: www.nhlbi.nih.gov  American Heart Association: www.heart.org Summary  The DASH eating plan is a healthy eating plan that has been shown to reduce high blood pressure (hypertension). It may also reduce your risk for type 2 diabetes, heart disease, and stroke.  With the DASH eating plan, you should limit salt (sodium) intake to 2,300 mg a day. If you have hypertension, you may need to reduce your sodium intake to 1,500 mg a day.  When on the DASH eating plan, aim to eat more fresh fruits and vegetables, whole grains, lean proteins, low-fat dairy, and heart-healthy fats.  Work with your health care provider or diet and nutrition specialist (dietitian) to adjust your eating plan to your individual   calorie needs. This information is not intended to replace advice given to you by your health care provider. Make sure you discuss any questions you have with your health care provider. Document Released: 03/29/2011 Document Revised: 04/02/2016 Document Reviewed: 04/02/2016 Elsevier Interactive Patient Education  2018 [employer] Diltiazem extended-release capsules or tablets What is this medicine? DILTIAZEM (dil TYE a  zem) is a calcium-channel blocker. It affects the amount of calcium found in your heart and muscle cells. This relaxes your blood vessels, which can reduce the amount of work the heart has to do. This medicine is used to treat high blood pressure and chest pain caused by angina. This medicine may be used for other purposes; ask your health care provider or pharmacist if you have questions. COMMON BRAND NAME(S): Cardizem CD, Cardizem LA, Cardizem SR, Cartia XT, Dilacor XR, Dilt-CD, Diltia XT, Diltzac, Matzim LA, Verna Czechaztia XT, Tiamate, Tiazac What should I tell my health care provider before I take this medicine? They need to know if you have any of these conditions: -heart problems, low blood pressure, irregular heartbeat -liver disease -previous heart attack -an unusual or allergic reaction to diltiazem, other medicines, foods, dyes, or preservatives -pregnant or trying to get pregnant -breast-feeding How should I use this medicine? Take this medicine by mouth with a glass of water. Follow the directions on the prescription label. Swallow whole, do not crush or chew. Ask your doctor or pharmacist if your should take this medicine with food. Take your doses at regular intervals. Do not take your medicine more often then directed. Do not stop taking except on the advice of your doctor or health care professional. Ask your doctor or health care professional how to gradually reduce the dose. Talk to your pediatrician regarding the use of this medicine in children. Special care may be needed. Overdosage: If you think you have taken too much of this medicine contact a poison control center or emergency room at once. NOTE: This medicine is only for you. Do not share this medicine with others. What if I miss a dose? If you miss a dose, take it as soon as you can. If it is almost time for your next dose, take only that dose. Do not take double or extra doses. What may interact with this medicine? Do not take  this medicine with any of the following medications: -cisapride -hawthorn -pimozide -ranolazine -red yeast rice This medicine may also interact with the following medications: -buspirone -carbamazepine -cimetidine -cyclosporine -digoxin -local anesthetics or general anesthetics -lovastatin -medicines for anxiety or difficulty sleeping like midazolam and triazolam -medicines for high blood pressure or heart problems -quinidine -rifampin, rifabutin, or rifapentine This list may not describe all possible interactions. Give your health care provider a list of all the medicines, herbs, non-prescription drugs, or dietary supplements you use. Also tell them if you smoke, drink alcohol, or use illegal drugs. Some items may interact with your medicine. What should I watch for while using this medicine? Check your blood pressure and pulse rate regularly. Ask your doctor or health care professional what your blood pressure and pulse rate should be and when you should contact him or her. You may feel dizzy or lightheaded. Do not drive, use machinery, or do anything that needs mental alertness until you know how this medicine affects you. To reduce the risk of dizzy or fainting spells, do not sit or stand up quickly, especially if you are an older patient. Alcohol can make you more dizzy or increase flushing  and rapid heartbeats. Avoid alcoholic drinks. What side effects may I notice from receiving this medicine? Side effects that you should report to your doctor or health care professional as soon as possible: -allergic reactions like skin rash, itching or hives, swelling of the face, lips, or tongue -confusion, mental depression -feeling faint or lightheaded, falls -redness, blistering, peeling or loosening of the skin, including inside the mouth -slow, irregular heartbeat -swelling of the feet and ankles -unusual bleeding or bruising, pinpoint red spots on the skin Side effects that usually do  not require medical attention (report to your doctor or health care professional if they continue or are bothersome): -constipation or diarrhea -difficulty sleeping -facial flushing -headache -nausea, vomiting -sexual dysfunction -weak or tired This list may not describe all possible side effects. Call your doctor for medical advice about side effects. You may report side effects to FDA at 1-800-FDA-1088. Where should I keep my medicine? Keep out of the reach of children. Store at room temperature between 15 and 30 degrees C (59 and 86 degrees F). Protect from humidity. Throw away any unused medicine after the expiration date. NOTE: This sheet is a summary. It may not cover all possible information. If you have questions about this medicine, talk to your doctor, pharmacist, or health care provider.  2018 Elsevier/Gold Standard (2007-07-31 14:35:47)

## 2017-08-13 ENCOUNTER — Other Ambulatory Visit (HOSPITAL_COMMUNITY): Payer: Self-pay

## 2017-08-13 DIAGNOSIS — F411 Generalized anxiety disorder: Secondary | ICD-10-CM

## 2017-08-13 MED ORDER — SERTRALINE HCL 50 MG PO TABS: ORAL_TABLET | ORAL | 0 refills | Status: DC

## 2017-08-13 NOTE — Progress Notes (Signed)
Refilled per Lysle Dingwallegan

## 2017-08-15 ENCOUNTER — Other Ambulatory Visit (HOSPITAL_COMMUNITY): Payer: Self-pay | Admitting: Psychiatry

## 2017-08-15 ENCOUNTER — Telehealth (HOSPITAL_COMMUNITY): Payer: Self-pay

## 2017-08-15 DIAGNOSIS — F900 Attention-deficit hyperactivity disorder, predominantly inattentive type: Secondary | ICD-10-CM

## 2017-08-15 MED ORDER — LISDEXAMFETAMINE DIMESYLATE 30 MG PO CAPS: 30.0000 mg | ORAL_CAPSULE | Freq: Every day | ORAL | 0 refills | Status: DC

## 2017-08-15 NOTE — Telephone Encounter (Signed)
I called patient and let him know his prescription was sent to the pharmacy

## 2017-08-15 NOTE — Telephone Encounter (Signed)
Patients wife is calling for a refill on patients Vyvanse, they follow up next week, but patient will be out on Saturday. They use [facility].

## 2017-08-15 NOTE — Telephone Encounter (Signed)
Vyvanse called into  his pharmacy.

## 2017-08-23 ENCOUNTER — Ambulatory Visit (INDEPENDENT_AMBULATORY_CARE_PROVIDER_SITE_OTHER): Payer: PRIVATE HEALTH INSURANCE | Admitting: Psychiatry

## 2017-08-23 DIAGNOSIS — F900 Attention-deficit hyperactivity disorder, predominantly inattentive type: Secondary | ICD-10-CM | POA: Diagnosis not present

## 2017-08-23 DIAGNOSIS — F411 Generalized anxiety disorder: Secondary | ICD-10-CM

## 2017-08-23 DIAGNOSIS — Z79899 Other long term (current) drug therapy: Secondary | ICD-10-CM | POA: Diagnosis not present

## 2017-08-23 DIAGNOSIS — F4010 Social phobia, unspecified: Secondary | ICD-10-CM | POA: Diagnosis not present

## 2017-08-23 DIAGNOSIS — Z630 Problems in relationship with spouse or partner: Secondary | ICD-10-CM

## 2017-08-23 MED ORDER — LISDEXAMFETAMINE DIMESYLATE 30 MG PO CAPS: 30.0000 mg | ORAL_CAPSULE | Freq: Every day | ORAL | 0 refills | Status: DC

## 2017-08-23 MED ORDER — LAMOTRIGINE 25 MG PO TABS: 50.0000 mg | ORAL_TABLET | Freq: Every day | ORAL | 0 refills | Status: DC

## 2017-08-23 MED ORDER — SERTRALINE HCL 100 MG PO TABS: 100.0000 mg | ORAL_TABLET | Freq: Every day | ORAL | 0 refills | Status: DC

## 2017-08-23 MED ORDER — LISDEXAMFETAMINE DIMESYLATE 30 MG PO CAPS
30.0000 mg | ORAL_CAPSULE | Freq: Every day | ORAL | 0 refills | Status: DC
Start: 2017-08-23 — End: 2017-08-23

## 2017-08-23 NOTE — Progress Notes (Signed)
BH MD/PA/NP OP Progress Note  08/23/2017 8:08 AM Chad Singh  MRN:  [id number]  Chief Complaint: I like new medication.  It is helping my focus.  HPI: Chad Singh came for his follow-up appointment.  He is a 42 year old Caucasian married employed man who was seen first time on March 28.  Patient has a diagnosis of ADD and generalized anxiety disorder.  He also had mood swings and irritability.  He was without his medication for a while and having symptoms of irritability, poor attention, poor concentration, difficulty multitasking and severe agitation.  We started him on Vyvanse, Lamictal and Zoloft.  He is doing much better.  He is able to complete his task on time.  His attention and concentration is improved.  He also noticed improvement in his marital relationship.  However he still feel some time issues with his wife but there has been no recent agitation, anger or any aggressive behavior.  Since taking the medication he does not exhibit any anger.  He is pleased that he is able to finish his task on time and his wife also noticed improvement in his behavior.  He recently saw his primary care physician who started him on antihypertensive medication and he started taking 2 days ago.  His blood pressure remains borderline high but he is hoping with the help of medication it to get better.  He has no rash, itching, tremors or shakes.  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  He lives with his wife and his 2 boys.  He has been married for 13 years.  Patient had assault charges towards his wife and court date in few weeks.  Patient denies drinking or using any illegal substances.  His energy level is good.  Visit Diagnosis:    ICD-10-CM   1. Attention deficit hyperactivity disorder (ADHD), predominantly inattentive type F90.0 lisdexamfetamine (VYVANSE) 30 MG capsule    DISCONTINUED: lisdexamfetamine (VYVANSE) 30 MG capsule    DISCONTINUED: lisdexamfetamine (VYVANSE) 30 MG capsule  2. GAD  (generalized anxiety disorder) F41.1 sertraline (ZOLOFT) 100 MG tablet    lamoTRIgine (LAMICTAL) 25 MG tablet    DISCONTINUED: sertraline (ZOLOFT) 100 MG tablet    DISCONTINUED: lamoTRIgine (LAMICTAL) 25 MG tablet    Past Psychiatric History: Reviewed. Patient had ADD and he using the stimulant in his childhood.  He took Concerta and Ritalin in his childhood and then Adderall Ativan and Zoloft prescribed by Dr. Tiajuana Amass.  He also remember taking lithium, Cymbalta, Prozac, Paxil, Depakote but stopped due to side effects.  He has been arrested few times in the past but charges were dropped.  Past Medical History:  Past Medical History:  Diagnosis Date  . Asthma   . GERD (gastroesophageal reflux disease)     Past Surgical History:  Procedure Laterality Date  . KNEE ARTHROCENTESIS    . NISSEN FUNDOPLICATION  2008    Family Psychiatric History: Reviewed  Family History: No family history on file.  Social History:  Social History   Socioeconomic History  . Marital status: Married    Spouse name: Not on file  . Number of children: Not on file  . Years of education: Not on file  . Highest education level: Not on file  Occupational History  . Not on file  Social Needs  . Financial resource strain: Not on file  . Food insecurity:    Worry: Not on file    Inability: Not on file  . Transportation needs:  Medical: Not on file    Non-medical: Not on file  Tobacco Use  . Smoking status: Never Smoker  . Smokeless tobacco: Never Used  Substance and Sexual Activity  . Alcohol use: Yes    Comment: social  . Drug use: No  . Sexual activity: Yes    Birth control/protection: None  Lifestyle  . Physical activity:    Days per week: Not on file    Minutes per session: Not on file  . Stress: Not on file  Relationships  . Social connections:    Talks on phone: Not on file    Gets together: Not on file    Attends religious service: Not on file    Active member of club or  organization: Not on file    Attends meetings of clubs or organizations: Not on file    Relationship status: Not on file  Other Topics Concern  . Not on file  Social History Narrative  . Not on file    Allergies:  Allergies  Allergen Reactions  . Codeine Nausea And Vomiting    Metabolic Disorder Labs: No results found for: HGBA1C, MPG No results found for: PROLACTIN Lab Results  Component Value Date   CHOL 185 07/10/2017   TRIG 111.0 07/10/2017   HDL 55.00 07/10/2017   CHOLHDL 3 07/10/2017   VLDL 22.2 07/10/2017   LDLCALC 108 (H) 07/10/2017   Lab Results  Component Value Date   TSH 1.18 07/10/2017    Therapeutic Level Labs: No results found for: LITHIUM No results found for: VALPROATE No components found for:  CBMZ  Current Medications: Current Outpatient Medications  Medication Sig Dispense Refill  . cetirizine (ZYRTEC) 10 MG tablet Take 10 mg by mouth.    . diltiazem (DILACOR XR) 120 MG 24 hr capsule Take 1 capsule (120 mg total) by mouth daily. 30 capsule 1  . lamoTRIgine (LAMICTAL) 25 MG tablet TAKE ONE TAB DAILY FOR 1 WEEK AND THAN 2 TAB DAILY 60 tablet 0  . lisdexamfetamine (VYVANSE) 30 MG capsule Take 1 capsule (30 mg total) by mouth daily. 30 capsule 0  . LORazepam (ATIVAN) 1 MG tablet     . montelukast (SINGULAIR) 10 MG tablet TAKE 1 TABLET BY MOUTH DAILY    . sertraline (ZOLOFT) 50 MG tablet Take one tab daily for 2 weeks and than 1 and 1/2 tab daily 30 tablet 0   No current facility-administered medications for this visit.      Musculoskeletal: Strength & Muscle Tone: within normal limits Gait & Station: normal Patient leans: N/A  Psychiatric Specialty Exam: Review of Systems  Respiratory: Negative.   Skin: Negative for itching and rash.  Psychiatric/Behavioral: The patient is nervous/anxious.     Blood pressure (!) 142/90, pulse 84, height 6' (1.829 m), weight 203 lb (92.1 kg).There is no height or weight on file to calculate BMI.  General  Appearance: Casual and Shy and anxious  Eye Contact:  Fair  Speech:  Slow  Volume:  Normal  Mood:  Anxious  Affect:  Congruent  Thought Process:  Goal Directed  Orientation:  Full (Time, Place, and Person)  Thought Content: Logical   Suicidal Thoughts:  No  Homicidal Thoughts:  No  Memory:  Immediate;   Good Recent;   Fair Remote;   Fair  Judgement:  Good  Insight:  Good  Psychomotor Activity:  Normal  Concentration:  Concentration: Fair and Attention Span: Fair  Recall:  Good  Fund of Knowledge: Good  Language:  Good  Akathisia:  No  Handed:  Right  AIMS (if indicated): not done  Assets:  Communication Skills Desire for Improvement Financial Resources/Insurance Housing Resilience Talents/Skills  ADL's:  Intact  Cognition: WNL  Sleep:  Good   Screenings:   Assessment and Plan: Attention deficit disorder, inattentive type.  Social anxiety disorder.  Rule out bipolar disorder, depressed type.  Patient doing better since he is taking low-dose stimulant, antianxiety and mood stabilizer.  He has not taken Ativan in a while.  He feels he is able to complete this task on time.  Patient has upcoming court date, he has assault charges towards his wife.  I do believe he should see a therapist and we will recommend to see a counselor in this office.  Continue Vyvanse 30 mg daily, Zoloft 100 mg daily and Lamictal 50 mg daily.  Patient has no rash, itching tremors or shakes.  Discussed in length medication side effects especially stimulant abuse, tolerance and withdrawal.  He just started taking hypertension medication.  Discussed healthy lifestyle and watch his calorie intake.  Recommended to call us back if he has any question or any concern.  Follow-up in 6 weeks   Cleotis Nipper, MD 08/23/2017, 8:08 AM

## 2017-08-23 NOTE — Addendum Note (Signed)
Addended by: Kathryne Sharper T on: 08/23/2017 10:37 AM   Modules accepted: Orders

## 2017-08-23 NOTE — Addendum Note (Signed)
Addended by: Kathryne Sharper T on: 08/23/2017 10:47 AM   Modules accepted: Orders

## 2017-09-10 ENCOUNTER — Other Ambulatory Visit: Payer: Self-pay | Admitting: Family Medicine

## 2017-09-10 ENCOUNTER — Encounter: Payer: Self-pay | Admitting: Family Medicine

## 2017-09-10 ENCOUNTER — Ambulatory Visit (INDEPENDENT_AMBULATORY_CARE_PROVIDER_SITE_OTHER): Payer: BLUE CROSS/BLUE SHIELD | Admitting: Family Medicine

## 2017-09-10 VITALS — BP 152/100 | HR 98 | Ht 72.0 in | Wt 204.0 lb

## 2017-09-10 DIAGNOSIS — I10 Essential (primary) hypertension: Secondary | ICD-10-CM | POA: Diagnosis not present

## 2017-09-10 MED ORDER — DILTIAZEM HCL ER 180 MG PO CP24: 180.0000 mg | ORAL_CAPSULE | Freq: Every day | ORAL | 1 refills | Status: DC

## 2017-09-10 NOTE — Telephone Encounter (Signed)
Patient is coming in for follow up at 4pm today with Dr. Doreene Burke. Will wait to see how BP is.

## 2017-09-10 NOTE — Progress Notes (Signed)
Subjective:  Patient ID: Chad Singh, male    DOB: [birth year]  Age: 42 y.o. MRN: [id number]  CC: Follow-up   HPI Chad Singh presents for follow-up of his blood pressure.  He has been taking the medicine daily and has no problems with it.  He denies swelling in his lower extremities.  He admits that he enjoyed a Coca[employer]a on a package of naps on the way over here.  He felt as though he was somewhat rushed to get here.  Denies headaches or blurred vision.  His wife is been checking his blood pressure from time to time but he cannot remember what she is been getting.  He did review the information on the DASH diet and admits that it is hard sometimes to avoid canned packaged foods in favor of fresh vegetables.  He does not enjoy bacon on a regular basis.  He does drink alcohol socially on the weekends.  He does not smoke.  Pointed out that his Vyvanse and Zoloft could also be affecting his blood pressure.  Outpatient Medications Prior to Visit  Medication Sig Dispense Refill  . cetirizine (ZYRTEC) 10 MG tablet Take 10 mg by mouth.    . lamoTRIgine (LAMICTAL) 25 MG tablet Take 2 tablets (50 mg total) by mouth daily. 180 tablet 0  . lisdexamfetamine (VYVANSE) 30 MG capsule Take 1 capsule (30 mg total) by mouth daily. 90 capsule 0  . montelukast (SINGULAIR) 10 MG tablet TAKE 1 TABLET BY MOUTH DAILY    . sertraline (ZOLOFT) 100 MG tablet Take 1 tablet (100 mg total) by mouth daily. 90 tablet 0  . diltiazem (DILACOR XR) 120 MG 24 hr capsule Take 1 capsule (120 mg total) by mouth daily. 30 capsule 1   No facility-administered medications prior to visit.     ROS Review of Systems  Constitutional: Negative for chills, fatigue, fever and unexpected weight change.  HENT: Negative.   Eyes: Negative for photophobia and visual disturbance.  Respiratory: Negative for chest tightness, shortness of breath and wheezing.   Cardiovascular: Negative for chest pain and palpitations.    Gastrointestinal: Negative.   Endocrine: Negative for cold intolerance and heat intolerance.  Genitourinary: Negative.   Musculoskeletal: Negative.   Skin: Negative for rash.  Allergic/Immunologic: Negative for immunocompromised state.  Neurological: Negative for light-headedness and headaches.  Hematological: Does not bruise/bleed easily.  Psychiatric/Behavioral: Negative.     Objective:  BP (!) 152/100   Pulse 98   Ht 6' (1.829 m)   Wt 204 lb (92.5 kg)   SpO2 96%   BMI 27.67 kg/m   BP Readings from Last 3 Encounters:  09/10/17 (!) 152/100  08/12/17 (!) 142/90  07/10/17 (!) 130/106    Wt Readings from Last 3 Encounters:  09/10/17 204 lb (92.5 kg)  08/12/17 201 lb 6 oz (91.3 kg)  07/10/17 205 lb (93 kg)    Physical Exam  Constitutional: He is oriented to person, place, and time. He appears well-developed and well-nourished. No distress.  HENT:  Head: Normocephalic and atraumatic.  Right Ear: External ear normal.  Left Ear: External ear normal.  Eyes: Right eye exhibits no discharge. Left eye exhibits no discharge. No scleral icterus.  Neck: No JVD present. No tracheal deviation present.  Cardiovascular: Normal rate, regular rhythm and normal heart sounds.  Pulmonary/Chest: Effort normal and breath sounds normal.  Neurological: He is alert and oriented to person, place, and time.  Skin: Skin is warm and dry. He is not diaphoretic.  Psychiatric: He has a normal mood and affect. His behavior is normal.    Lab Results  Component Value Date   WBC 10.6 (H) 07/10/2017   HGB 15.4 07/10/2017   HCT 45.1 07/10/2017   PLT 306.0 07/10/2017   GLUCOSE 109 (H) 07/10/2017   CHOL 185 07/10/2017   TRIG 111.0 07/10/2017   HDL 55.00 07/10/2017   LDLCALC 108 (H) 07/10/2017   ALT 27 07/10/2017   AST 20 07/10/2017   NA 142 07/10/2017   K 4.2 07/10/2017   CL 106 07/10/2017   CREATININE 1.17 07/10/2017   BUN 10 07/10/2017   CO2 23 07/10/2017   TSH 1.18 07/10/2017    Mr  Lumbar Spine Wo Contrast  Result Date: 09/04/2010 *RADIOLOGY REPORT* Clinical Data: Low back pain extending to the groin , right worse than left.  Right leg pain. MRI LUMBAR SPINE WITHOUT CONTRAST Technique:  Multiplanar and multiecho pulse sequences of the lumbar spine were obtained without intravenous contrast. Comparison: None Findings: There is no abnormality at L3-4 or above.  The discs are unremarkable.  The canal and foramina are widely patent.  The distal cord and conus are normal. At L4-5, the disc is degenerated with annular tearing annular bulging.  This encroaches mildly upon the spinal canal but does not appear to cause definite neural compression. At L5-S1, there is mild bulging of the disc but no neural compression. No osseous or articular pathology. IMPRESSION: Degenerative disc disease at L4-5 with annular tearing and annular bulging.  This could certainly result in back pain or neural irritation.  Compressive stenosis is not demonstrated however. Minimal non compressive disc bulge at L5-S1. Original Report Authenticated By: Thomasenia Sales, M.D.   Assessment & Plan:   Alecxander was seen today for follow-up.  Diagnoses and all orders for this visit:  Essential hypertension -     diltiazem (DILACOR XR) 180 MG 24 hr capsule; Take 1 capsule (180 mg total) by mouth daily.   I have discontinued Chad Singh's diltiazem. I am also having him start on diltiazem. Additionally, I am having him maintain his cetirizine, montelukast, lisdexamfetamine, sertraline, and lamoTRIgine.  Meds ordered this encounter  Medications  . diltiazem (DILACOR XR) 180 MG 24 hr capsule    Sig: Take 1 capsule (180 mg total) by mouth daily.    Dispense:  30 capsule    Refill:  1   Encouraged the patient to continue to check his blood pressure at home and bring me in a list.  Suspect that with this patient there may be a whitecoat component.  Encouraged him to limit the salt in his diet blood pressure  measured at the end of the exam was 142/96.  Follow-up in 4 to 6 weeks.  Follow-up: Return in about 5 weeks (around 10/15/2017).  Mliss Sax, MD

## 2017-09-16 ENCOUNTER — Ambulatory Visit (HOSPITAL_COMMUNITY): Payer: Self-pay | Admitting: Licensed Clinical Social Worker

## 2017-09-27 ENCOUNTER — Ambulatory Visit (HOSPITAL_COMMUNITY): Payer: BLUE CROSS/BLUE SHIELD | Admitting: Psychiatry

## 2017-10-11 ENCOUNTER — Other Ambulatory Visit: Payer: Self-pay | Admitting: Family Medicine

## 2017-10-11 DIAGNOSIS — I10 Essential (primary) hypertension: Secondary | ICD-10-CM

## 2017-10-16 ENCOUNTER — Encounter: Payer: Self-pay | Admitting: Family Medicine

## 2017-10-16 ENCOUNTER — Ambulatory Visit (INDEPENDENT_AMBULATORY_CARE_PROVIDER_SITE_OTHER): Payer: BLUE CROSS/BLUE SHIELD | Admitting: Family Medicine

## 2017-10-16 VITALS — BP 130/100 | HR 96 | Temp 98.6°F | Ht 72.0 in | Wt 204.0 lb

## 2017-10-16 DIAGNOSIS — I10 Essential (primary) hypertension: Secondary | ICD-10-CM | POA: Diagnosis not present

## 2017-10-16 MED ORDER — DILTIAZEM HCL ER 240 MG PO CP24: 240.0000 mg | ORAL_CAPSULE | Freq: Every day | ORAL | 0 refills | Status: DC

## 2017-10-16 NOTE — Progress Notes (Signed)
Subjective:  Patient ID: Feliciana RossettiRobert D Olejniczak, male    DOB: 1975/08/23  Age: 42 y.o. MRN: [id number]  CC: Follow-up (follow up  didnt bring the list of BP)   HPI Feliciana RossettiRobert D Deal presents for follow-up of his blood pressure.  Patient's wife is been checking it.  It is been running in the low 140s over upper 80s typically.  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  His blood pressure came down to 140/88 after they allowed him to lie down for 20 minutes or so.  He is having no issues with the medicine.  No lower extremity edema.  She is she has tried a is she is she is goofy soon  Outpatient Medications Prior to Visit  Medication Sig Dispense Refill  . cetirizine (ZYRTEC) 10 MG tablet Take 10 mg by mouth.    . lamoTRIgine (LAMICTAL) 25 MG tablet Take 2 tablets (50 mg total) by mouth daily. 180 tablet 0  . lisdexamfetamine (VYVANSE) 30 MG capsule Take 1 capsule (30 mg total) by mouth daily. 90 capsule 0  . montelukast (SINGULAIR) 10 MG tablet TAKE 1 TABLET BY MOUTH DAILY    . sertraline (ZOLOFT) 100 MG tablet Take 1 tablet (100 mg total) by mouth daily. 90 tablet 0  . DILT-XR 180 MG 24 hr capsule TAKE 1 CAPSULE BY MOUTH EVERY DAY 30 capsule 0   No facility-administered medications prior to visit.     ROS Review of Systems  Constitutional: Negative for chills, fatigue, fever and unexpected weight change.  HENT: Positive for hearing loss.   Eyes: Negative.   Respiratory: Negative.   Cardiovascular: Negative.   Gastrointestinal: Negative.   Genitourinary: Negative.   Musculoskeletal: Negative for arthralgias and myalgias.  Neurological: Negative for tremors and weakness.  Hematological: Does not bruise/bleed easily.  Psychiatric/Behavioral: Negative.     Objective:  BP (!) 130/100   Pulse 96   Temp 98.6 F (37 C) (Oral)   Ht 6' (1.829 m)   Wt 204 lb (92.5 kg)   SpO2 98%   BMI 27.67 kg/m   BP Readings from Last 3 Encounters:  10/16/17 (!) 130/100  09/10/17 (!) 152/100  08/12/17  (!) 142/90    Wt Readings from Last 3 Encounters:  10/16/17 204 lb (92.5 kg)  09/10/17 204 lb (92.5 kg)  08/12/17 201 lb 6 oz (91.3 kg)    Physical Exam  Constitutional: He is oriented to person, place, and time. He appears well-developed and well-nourished. No distress.  HENT:  Head: Normocephalic and atraumatic.  Right Ear: External ear normal.  Left Ear: External ear normal.  Mouth/Throat: Oropharynx is clear and moist.  Eyes: Pupils are equal, round, and reactive to light. Conjunctivae and EOM are normal. Right eye exhibits no discharge. Left eye exhibits no discharge. No scleral icterus.  Neck: Neck supple. No JVD present. No tracheal deviation present. No thyromegaly present.  Cardiovascular: Normal rate, regular rhythm and normal heart sounds.  Pulmonary/Chest: Effort normal and breath sounds normal.  Abdominal: Bowel sounds are normal.  Lymphadenopathy:    He has no cervical adenopathy.  Neurological: He is alert and oriented to person, place, and time.  Skin: Skin is warm and dry. He is not diaphoretic.  Psychiatric: He has a normal mood and affect. His behavior is normal.    Lab Results  Component Value Date   WBC 10.6 (H) 07/10/2017   HGB 15.4 07/10/2017   HCT 45.1 07/10/2017   PLT 306.0 07/10/2017   GLUCOSE 109 (  H) 07/10/2017   CHOL 185 07/10/2017   TRIG 111.0 07/10/2017   HDL 55.00 07/10/2017   LDLCALC 108 (H) 07/10/2017   ALT 27 07/10/2017   AST 20 07/10/2017   NA 142 07/10/2017   K 4.2 07/10/2017   CL 106 07/10/2017   CREATININE 1.17 07/10/2017   BUN 10 07/10/2017   CO2 23 07/10/2017   TSH 1.18 07/10/2017    Mr Lumbar Spine Wo Contrast  Result Date: 09/04/2010 *RADIOLOGY REPORT* Clinical Data: Low back pain extending to the groin , right worse than left.  Right leg pain. MRI LUMBAR SPINE WITHOUT CONTRAST Technique:  Multiplanar and multiecho pulse sequences of the lumbar spine were obtained without intravenous contrast. Comparison: None Findings:  There is no abnormality at L3-4 or above.  The discs are unremarkable.  The canal and foramina are widely patent.  The distal cord and conus are normal. At L4-5, the disc is degenerated with annular tearing annular bulging.  This encroaches mildly upon the spinal canal but does not appear to cause definite neural compression. At L5-S1, there is mild bulging of the disc but no neural compression. No osseous or articular pathology. IMPRESSION: Degenerative disc disease at L4-5 with annular tearing and annular bulging.  This could certainly result in back pain or neural irritation.  Compressive stenosis is not demonstrated however. Minimal non compressive disc bulge at L5-S1. Original Report Authenticated By: Thomasenia Sales, M.D.   Assessment & Plan:   Tell was seen today for follow-up.  Diagnoses and all orders for this visit:  Essential hypertension -     diltiazem (DILACOR XR) 240 MG 24 hr capsule; Take 1 capsule (240 mg total) by mouth daily.   I have discontinued Barbette Hair. Amparo's DILT-XR. I am also having him start on diltiazem. Additionally, I am having him maintain his cetirizine, montelukast, lisdexamfetamine, sertraline, and lamoTRIgine.  Meds ordered this encounter  Medications  . diltiazem (DILACOR XR) 240 MG 24 hr capsule    Sig: Take 1 capsule (240 mg total) by mouth daily.    Dispense:  90 capsule    Refill:  0     Follow-up: Return Return in 2-3 months. Check and record blood pressure.  Mliss Sax, MD

## 2017-12-07 ENCOUNTER — Other Ambulatory Visit: Payer: Self-pay | Admitting: Family Medicine

## 2017-12-07 DIAGNOSIS — I10 Essential (primary) hypertension: Secondary | ICD-10-CM

## 2017-12-17 ENCOUNTER — Ambulatory Visit: Payer: BLUE CROSS/BLUE SHIELD | Admitting: Family Medicine

## 2018-02-05 ENCOUNTER — Ambulatory Visit (INDEPENDENT_AMBULATORY_CARE_PROVIDER_SITE_OTHER): Payer: BLUE CROSS/BLUE SHIELD | Admitting: Family Medicine

## 2018-02-05 VITALS — BP 150/100 | HR 101 | Temp 98.2°F | Wt 209.4 lb

## 2018-02-05 DIAGNOSIS — J069 Acute upper respiratory infection, unspecified: Secondary | ICD-10-CM | POA: Diagnosis not present

## 2018-02-05 MED ORDER — OSELTAMIVIR PHOSPHATE 75 MG PO CAPS: 75.0000 mg | ORAL_CAPSULE | Freq: Two times a day (BID) | ORAL | 0 refills | Status: DC

## 2018-02-05 NOTE — Patient Instructions (Signed)
Nice to meet you  Please try things such as zyrtec-D or allegra-D which is an antihistamine and decongestant.  Please try afrin which will help with nasal congestion but use for only three days.  Honey can help with a cough   Please avoid getting water in your ear. You can try using a cotton ball.  Please give [place] a call back if your symptoms don't improve after 7 days.

## 2018-02-05 NOTE — Progress Notes (Signed)
Chad Singh - 42 y.o. male MRN [id number]  Date of birth: 07/19/1975  SUBJECTIVE:  Including CC & ROS.  Chief Complaint  Patient presents with  . Nasal Congestion  . Sinusitis    facial pain, ears popping   . Cough  . Shortness of Breath    Chad Singh is a 42 y.o. male that is presenting with cough, congestion, myalgias, malaise.  His symptoms started last night.  He is recently returned from [place].  He denies any exposures to anyone with similar symptoms.  He feels that he was feverish last night.  He is having head congestion is worse with lying down.  He has not tried any medications.  He feels like his symptoms are staying the same.  He has not received a flu vaccine yet.   Review of Systems  Constitutional: Positive for activity change.  HENT: Positive for congestion, ear pain and sinus pressure.   Respiratory: Negative for cough.   Cardiovascular: Negative for chest pain.  Gastrointestinal: Negative for abdominal pain.  Musculoskeletal: Negative for back pain.  Skin: Negative for color change.    HISTORY: Past Medical, Surgical, Social, and Family History Reviewed & Updated per EMR.   Pertinent Historical Findings include:  Past Medical History:  Diagnosis Date  . Asthma   . GERD (gastroesophageal reflux disease)     Past Surgical History:  Procedure Laterality Date  . KNEE ARTHROCENTESIS    . NISSEN FUNDOPLICATION  2008    Allergies  Allergen Reactions  . Codeine Nausea And Vomiting    No family history on file.   Social History   Socioeconomic History  . Marital status: Married    Spouse name: Not on file  . Number of children: Not on file  . Years of education: Not on file  . Highest education level: Not on file  Occupational History  . Not on file  Social Needs  . Financial resource strain: Not on file  . Food insecurity:    Worry: Not on file    Inability: Not on file  . Transportation needs:    Medical: Not on file   Non-medical: Not on file  Tobacco Use  . Smoking status: Never Smoker  . Smokeless tobacco: Never Used  Substance and Sexual Activity  . Alcohol use: Yes    Comment: social  . Drug use: No  . Sexual activity: Yes    Birth control/protection: None  Lifestyle  . Physical activity:    Days per week: Not on file    Minutes per session: Not on file  . Stress: Not on file  Relationships  . Social connections:    Talks on phone: Not on file    Gets together: Not on file    Attends religious service: Not on file    Active member of club or organization: Not on file    Attends meetings of clubs or organizations: Not on file    Relationship status: Not on file  . Intimate partner violence:    Fear of current or ex partner: Not on file    Emotionally abused: Not on file    Physically abused: Not on file    Forced sexual activity: Not on file  Other Topics Concern  . Not on file  Social History Narrative  . Not on file     PHYSICAL EXAM:  VS: BP (!) 150/100   Pulse (!) 101   Temp 98.2 F (36.8 C) (Oral)   Wt  209 lb 6.4 oz (95 kg)   SpO2 97%   BMI 28.40 kg/m  Physical Exam Gen: NAD, alert, cooperative with exam, ENT: normal lips, normal nasal mucosa, right tympanic membrane appears to be sunken in with no erythema and having fluid extruding from it with a possible rupture, left tympanic membrane appears to be sunken but no erythema or bulging.  No cervical lymphadenopathy.  No tonsillar exudates. Eye: normal EOM, normal conjunctiva and lids CV:  no edema, +2 pedal pulses, regular rate and rhythm, S1-S2   Resp: no accessory muscle use, non-labored, clear to auscultation bilaterally, no crackles or wheezes Skin: no rashes, no areas of induration  Neuro: normal tone, normal sensation to touch Psych:  normal insight, alert and oriented MSK: Normal gait, normal strength       ASSESSMENT & PLAN:   Viral URI Possibly flu or other viral syndrome.  Less likely for it to be a  bacterial infection with the acuteness of his symptoms. -Tamiflu -Counseled on supportive care. -Given indications to follow-up.

## 2018-02-05 NOTE — Assessment & Plan Note (Signed)
Possibly flu or other viral syndrome.  Less likely for it to be a bacterial infection with the acuteness of his symptoms. -Tamiflu -Counseled on supportive care. -Given indications to follow-up.

## 2018-08-20 ENCOUNTER — Telehealth: Payer: Self-pay | Admitting: Family Medicine

## 2018-08-20 NOTE — Telephone Encounter (Signed)
Called and could not leave vm for patient. Calling to schedule virtual visit with Dr. Doreene Burke for BP.

## 2018-09-09 ENCOUNTER — Ambulatory Visit (INDEPENDENT_AMBULATORY_CARE_PROVIDER_SITE_OTHER): Payer: BLUE CROSS/BLUE SHIELD | Admitting: Family Medicine

## 2018-09-09 ENCOUNTER — Encounter: Payer: Self-pay | Admitting: Family Medicine

## 2018-09-09 VITALS — Ht 72.0 in

## 2018-09-09 DIAGNOSIS — I10 Essential (primary) hypertension: Secondary | ICD-10-CM

## 2018-09-09 DIAGNOSIS — J301 Allergic rhinitis due to pollen: Secondary | ICD-10-CM

## 2018-09-09 MED ORDER — DILTIAZEM HCL ER 240 MG PO CP24: 240.0000 mg | ORAL_CAPSULE | Freq: Every day | ORAL | 0 refills | Status: DC

## 2018-09-09 MED ORDER — MONTELUKAST SODIUM 10 MG PO TABS: 10.0000 mg | ORAL_TABLET | Freq: Every day | ORAL | 1 refills | Status: DC

## 2018-09-09 MED ORDER — CETIRIZINE HCL 10 MG PO TABS: 10.0000 mg | ORAL_TABLET | Freq: Every day | ORAL | 1 refills | Status: DC

## 2018-09-09 NOTE — Progress Notes (Signed)
Virtual Visit via Video Note  I connected with Chad Singh on 09/09/18 at  1:30 PM EDT by a video enabled telemedicine application and verified that I am speaking with the correct person using two identifiers.  Location: Patient: home Provider:    I discussed the limitations of evaluation and management by telemedicine and the availability of in person appointments. The patient expressed understanding and agreed to proceed.  History of Present Illness:    Observations/Objective:   Assessment and Plan:   Follow Up Instructions:    I discussed the assessment and treatment plan with the patient. The patient was provided an opportunity to ask questions and all were answered. The patient agreed with the plan and demonstrated an understanding of the instructions.   The patient was advised to call back or seek an in-person evaluation if the symptoms worsen or if the condition fails to improve as anticipated.  I provided 74   Established Patient Office Visit  Subjective:  Patient ID: Chad Singh, male    DOB: Aug 05, 1975  Age: 43 y.o. MRN: [id number]  CC:  Chief Complaint  Patient presents with  . Medication Refill    HPI ADARIUS ORDERS presents for follow-up of his blood pressure.  He has been out of the diltiazem for some time now.  He had tolerated the medicine well.  Blood pressure was controlled with it.  Denies any issues taking it he had just run out.  He has made some positive lifestyle changes by decreasing his intake of alcohol and caffeine.  Tells of significant allergy rhinitis symptoms that include itchy watery eyes, nose throat and ears.  There is often times swelling around his eyes.  Occasionally experiences reactive airway disease.  There is been no fevers or chills nausea or vomiting.  Typically controlled well with Zyrtec and Singulair.  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  Lives with his wife and 72 and 83-year-old children.  Not  as active at home as he would like to be.  His job is quite physical and active.  Wife continues to work as a CMA at the surgery center.  Past Medical History:  Diagnosis Date  . Asthma   . GERD (gastroesophageal reflux disease)     Past Surgical History:  Procedure Laterality Date  . KNEE ARTHROCENTESIS    . NISSEN FUNDOPLICATION  2008    History reviewed. No pertinent family history.  Social History   Socioeconomic History  . Marital status: Married    Spouse name: Not on file  . Number of children: Not on file  . Years of education: Not on file  . Highest education level: Not on file  Occupational History  . Not on file  Social Needs  . Financial resource strain: Not on file  . Food insecurity:    Worry: Not on file    Inability: Not on file  . Transportation needs:    Medical: Not on file    Non-medical: Not on file  Tobacco Use  . Smoking status: Never Smoker  . Smokeless tobacco: Never Used  Substance and Sexual Activity  . Alcohol use: Yes    Comment: occasional wine with dinner   . Drug use: No  . Sexual activity: Yes    Birth control/protection: None  Lifestyle  . Physical activity:    Days per week: Not on file    Minutes per session: Not on file  . Stress: Not on file  Relationships  .  Social connections:    Talks on phone: Not on file    Gets together: Not on file    Attends religious service: Not on file    Active member of club or organization: Not on file    Attends meetings of clubs or organizations: Not on file    Relationship status: Not on file  . Intimate partner violence:    Fear of current or ex partner: Not on file    Emotionally abused: Not on file    Physically abused: Not on file    Forced sexual activity: Not on file  Other Topics Concern  . Not on file  Social History Narrative  . Not on file    Outpatient Medications Prior to Visit  Medication Sig Dispense Refill  . esomeprazole (NEXIUM) 20 MG capsule Take 20 mg by  mouth daily at 12 noon.    . lamoTRIgine (LAMICTAL) 25 MG tablet Take 2 tablets (50 mg total) by mouth daily. (Patient not taking: Reported on 02/05/2018) 180 tablet 0  . lisdexamfetamine (VYVANSE) 30 MG capsule Take 1 capsule (30 mg total) by mouth daily. (Patient not taking: Reported on 02/05/2018) 90 capsule 0  . sertraline (ZOLOFT) 100 MG tablet Take 1 tablet (100 mg total) by mouth daily. (Patient not taking: Reported on 02/05/2018) 90 tablet 0  . cetirizine (ZYRTEC) 10 MG tablet Take 10 mg by mouth.    . diltiazem (DILACOR XR) 240 MG 24 hr capsule Take 1 capsule (240 mg total) by mouth daily. (Patient not taking: Reported on 02/05/2018) 90 capsule 0  . montelukast (SINGULAIR) 10 MG tablet TAKE 1 TABLET BY MOUTH DAILY    . oseltamivir (TAMIFLU) 75 MG capsule Take 1 capsule (75 mg total) by mouth 2 (two) times daily. 10 capsule 0   No facility-administered medications prior to visit.     Allergies  Allergen Reactions  . Codeine Nausea And Vomiting    ROS Review of Systems  Constitutional: Negative.   HENT: Positive for congestion, postnasal drip and sneezing. Negative for sinus pressure, sinus pain, sore throat and trouble swallowing.   Eyes: Positive for itching.  Respiratory: Negative.   Cardiovascular: Negative.   Gastrointestinal: Negative.   Genitourinary: Negative.   Musculoskeletal: Negative for gait problem and joint swelling.  Allergic/Immunologic: Negative for immunocompromised state.  Hematological: Does not bruise/bleed easily.  Psychiatric/Behavioral: Negative.       Objective:    Physical Exam  Constitutional: He is oriented to person, place, and time. He appears well-developed and well-nourished. No distress.  HENT:  Head: Normocephalic and atraumatic.  Right Ear: External ear normal.  Left Ear: External ear normal.  Eyes: Conjunctivae are normal. Right eye exhibits no discharge. Left eye exhibits no discharge. No scleral icterus.  Neck: No JVD present. No  tracheal deviation present.  Pulmonary/Chest: Effort normal. No stridor.  Neurological: He is alert and oriented to person, place, and time.  Skin: Skin is warm and dry. He is not diaphoretic.  Psychiatric: He has a normal mood and affect. His behavior is normal.    Ht 6' (1.829 m)   BMI 28.40 kg/m  Wt Readings from Last 3 Encounters:  02/05/18 209 lb 6.4 oz (95 kg)  10/16/17 204 lb (92.5 kg)  09/10/17 204 lb (92.5 kg)     Health Maintenance Due  Topic Date Due  . HIV Screening  01/14/1993  . TETANUS/TDAP  01/14/1997    There are no preventive care reminders to display for this patient.  Lab  Results  Component Value Date   TSH 1.18 07/10/2017   Lab Results  Component Value Date   WBC 10.6 (H) 07/10/2017   HGB 15.4 07/10/2017   HCT 45.1 07/10/2017   MCV 90.5 07/10/2017   PLT 306.0 07/10/2017   Lab Results  Component Value Date   NA 142 07/10/2017   K 4.2 07/10/2017   CO2 23 07/10/2017   GLUCOSE 109 (H) 07/10/2017   BUN 10 07/10/2017   CREATININE 1.17 07/10/2017   BILITOT 0.6 07/10/2017   AST 20 07/10/2017   ALT 27 07/10/2017   PROT 7.4 07/10/2017   CALCIUM 9.7 07/10/2017   Lab Results  Component Value Date   CHOL 185 07/10/2017   Lab Results  Component Value Date   HDL 55.00 07/10/2017   Lab Results  Component Value Date   LDLCALC 108 (H) 07/10/2017   Lab Results  Component Value Date   TRIG 111.0 07/10/2017   Lab Results  Component Value Date   CHOLHDL 3 07/10/2017   No results found for: HGBA1C    Assessment & Plan:   Problem List Items Addressed This Visit      Cardiovascular and Mediastinum   Essential hypertension   Relevant Medications   diltiazem (DILACOR XR) 240 MG 24 hr capsule     Respiratory   Allergic rhinitis - Primary   Relevant Medications   montelukast (SINGULAIR) 10 MG tablet   cetirizine (ZYRTEC) 10 MG tablet      Meds ordered this encounter  Medications  . montelukast (SINGULAIR) 10 MG tablet    Sig: Take  1 tablet (10 mg total) by mouth at bedtime. TAKE 1 TABLET BY MOUTH DAILY    Dispense:  90 tablet    Refill:  1  . cetirizine (ZYRTEC) 10 MG tablet    Sig: Take 1 tablet (10 mg total) by mouth at bedtime.    Dispense:  90 tablet    Refill:  1  . diltiazem (DILACOR XR) 240 MG 24 hr capsule    Sig: Take 1 capsule (240 mg total) by mouth daily.    Dispense:  90 capsule    Refill:  0    Follow-up: Return in about 3 months (around 12/10/2018), or check and record bp.    Mliss Sax, MD25 minutes of non-face-to-face time during this encounter.   Restart blood pressure medicine and check and record blood pressure.  Follow-up visit in 3 months for a physical.  Patient will consider adding a nasal steroid that may possibly replace the Zyrtec.

## 2018-12-06 ENCOUNTER — Other Ambulatory Visit: Payer: Self-pay | Admitting: Family Medicine

## 2018-12-06 DIAGNOSIS — I10 Essential (primary) hypertension: Secondary | ICD-10-CM

## 2019-03-03 ENCOUNTER — Ambulatory Visit (INDEPENDENT_AMBULATORY_CARE_PROVIDER_SITE_OTHER): Payer: BC Managed Care – PPO | Admitting: Family Medicine

## 2019-03-03 ENCOUNTER — Encounter: Payer: Self-pay | Admitting: Family Medicine

## 2019-03-03 DIAGNOSIS — J4521 Mild intermittent asthma with (acute) exacerbation: Secondary | ICD-10-CM | POA: Diagnosis not present

## 2019-03-03 DIAGNOSIS — J069 Acute upper respiratory infection, unspecified: Secondary | ICD-10-CM

## 2019-03-03 MED ORDER — PREDNISONE 20 MG PO TABS: 20.0000 mg | ORAL_TABLET | Freq: Two times a day (BID) | ORAL | 0 refills | Status: AC

## 2019-03-03 MED ORDER — ALBUTEROL SULFATE HFA 108 (90 BASE) MCG/ACT IN AERS: 1.0000 | INHALATION_SPRAY | Freq: Four times a day (QID) | RESPIRATORY_TRACT | 0 refills | Status: DC | PRN

## 2019-03-03 NOTE — Progress Notes (Signed)
Established Patient Office Visit  Subjective:  Patient ID: Chad Singh, male    DOB: [birth year]  Age: 43 y.o. MRN: [id number]  CC:  Chief Complaint  Patient presents with  . Sinusitis    Pt agrees to virtual visit. Pt is C/O sinus Sx started yeterday.  Dramatically worse over the past several hours. Denies any body aches.  Has has has an intermittent headaches that is moderate in nature. Has pressure frontal and periorbital. Post nasal drip clear to green nasal mucous, eyes draining, cough started today with some production of clear sputum. Feels fatigued "pretty lousy." He also has chills but does not have a thermometer.    HPI Chad Singh presents for evaluation of 1 day ho malaise, fatigue, sore throat, [place], pnd, watery eyes. No change of taste or smell. Decreased appetite. Some wheezing but no difficulty breathing at this point.  Patient has a history of reactive airway disease with allergy rhinitis and upper respiratory tract infections.  Has felt chilled but has felt warm. Little myalgias and arthralgias.  He does not smoke.  Past Medical History:  Diagnosis Date  . Asthma   . GERD (gastroesophageal reflux disease)     Past Surgical History:  Procedure Laterality Date  . KNEE ARTHROCENTESIS    . NISSEN FUNDOPLICATION  2008    History reviewed. No pertinent family history.  Social History   Socioeconomic History  . Marital status: Married    Spouse name: Not on file  . Number of children: Not on file  . Years of education: Not on file  . Highest education level: Not on file  Occupational History  . Not on file  Social Needs  . Financial resource strain: Not on file  . Food insecurity    Worry: Not on file    Inability: Not on file  . Transportation needs    Medical: Not on file    Non-medical: Not on file  Tobacco Use  . Smoking status: Never Smoker  . Smokeless tobacco: Never Used  Substance and Sexual Activity  . Alcohol use: Yes    Comment:  occasional wine with dinner   . Drug use: No  . Sexual activity: Yes    Birth control/protection: None  Lifestyle  . Physical activity    Days per week: Not on file    Minutes per session: Not on file  . Stress: Not on file  Relationships  . Social Musician on phone: Not on file    Gets together: Not on file    Attends religious service: Not on file    Active member of club or organization: Not on file    Attends meetings of clubs or organizations: Not on file    Relationship status: Not on file  . Intimate partner violence    Fear of current or ex partner: Not on file    Emotionally abused: Not on file    Physically abused: Not on file    Forced sexual activity: Not on file  Other Topics Concern  . Not on file  Social History Narrative  . Not on file    Outpatient Medications Prior to Visit  Medication Sig Dispense Refill  . cetirizine (ZYRTEC) 10 MG tablet Take 1 tablet (10 mg total) by mouth at bedtime. 90 tablet 1  . DILT-XR 240 MG 24 hr capsule TAKE 1 CAPSULE BY MOUTH EVERY DAY 90 capsule 0  . esomeprazole (NEXIUM) 20 MG capsule Take 20  mg by mouth daily at 12 noon.    . montelukast (SINGULAIR) 10 MG tablet Take 1 tablet (10 mg total) by mouth at bedtime. TAKE 1 TABLET BY MOUTH DAILY 90 tablet 1  . lamoTRIgine (LAMICTAL) 25 MG tablet Take 2 tablets (50 mg total) by mouth daily. (Patient not taking: Reported on 02/05/2018) 180 tablet 0  . lisdexamfetamine (VYVANSE) 30 MG capsule Take 1 capsule (30 mg total) by mouth daily. (Patient not taking: Reported on 02/05/2018) 90 capsule 0  . sertraline (ZOLOFT) 100 MG tablet Take 1 tablet (100 mg total) by mouth daily. (Patient not taking: Reported on 02/05/2018) 90 tablet 0   No facility-administered medications prior to visit.     Allergies  Allergen Reactions  . Codeine Nausea And Vomiting    ROS Review of Systems  Constitutional: Positive for chills and fatigue. Negative for diaphoresis, fever and unexpected  weight change.  HENT: Positive for congestion, postnasal drip, rhinorrhea and sore throat. Negative for voice change.   Eyes: Negative for photophobia and visual disturbance.  Respiratory: Positive for wheezing. Negative for chest tightness and shortness of breath.   Cardiovascular: Negative.   Gastrointestinal: Negative.  Negative for diarrhea, nausea and vomiting.  Genitourinary: Negative.   Musculoskeletal: Negative for arthralgias and myalgias.  Skin: Negative for pallor and rash.  Allergic/Immunologic: Negative for immunocompromised state.  Neurological: Negative for headaches.  Hematological: Negative.   Psychiatric/Behavioral: Negative.       Objective:    Physical Exam  Constitutional: He is oriented to person, place, and time. He appears well-developed and well-nourished. No distress.  HENT:  Head: Normocephalic and atraumatic.  Right Ear: External ear normal.  Left Ear: External ear normal.  Eyes: Conjunctivae are normal. Right eye exhibits no discharge. Left eye exhibits no discharge. No scleral icterus.  Neck: No JVD present. No tracheal deviation present.  Pulmonary/Chest: Effort normal. No stridor.  Neurological: He is alert and oriented to person, place, and time.  Skin: Skin is warm and dry. He is not diaphoretic.  Psychiatric: He has a normal mood and affect. His behavior is normal.    There were no vitals taken for this visit. Wt Readings from Last 3 Encounters:  02/05/18 209 lb 6.4 oz (95 kg)  10/16/17 204 lb (92.5 kg)  09/10/17 204 lb (92.5 kg)     There are no preventive care reminders to display for this patient.  There are no preventive care reminders to display for this patient.  Lab Results  Component Value Date   TSH 1.18 07/10/2017   Lab Results  Component Value Date   WBC 10.6 (H) 07/10/2017   HGB 15.4 07/10/2017   HCT 45.1 07/10/2017   MCV 90.5 07/10/2017   PLT 306.0 07/10/2017   Lab Results  Component Value Date   NA 142  07/10/2017   K 4.2 07/10/2017   CO2 23 07/10/2017   GLUCOSE 109 (H) 07/10/2017   BUN 10 07/10/2017   CREATININE 1.17 07/10/2017   BILITOT 0.6 07/10/2017   AST 20 07/10/2017   ALT 27 07/10/2017   PROT 7.4 07/10/2017   CALCIUM 9.7 07/10/2017   Lab Results  Component Value Date   CHOL 185 07/10/2017   Lab Results  Component Value Date   HDL 55.00 07/10/2017   Lab Results  Component Value Date   LDLCALC 108 (H) 07/10/2017   Lab Results  Component Value Date   TRIG 111.0 07/10/2017   Lab Results  Component Value Date   CHOLHDL 3  07/10/2017   No results found for: HGBA1C    Assessment & Plan:   Problem List Items Addressed This Visit      Respiratory   Viral URI - Primary    Other Visit Diagnoses    Mild intermittent reactive airway disease with acute exacerbation       Relevant Medications   albuterol (VENTOLIN HFA) 108 (90 Base) MCG/ACT inhaler   predniSONE (DELTASONE) 20 MG tablet      Meds ordered this encounter  Medications  . albuterol (VENTOLIN HFA) 108 (90 Base) MCG/ACT inhaler    Sig: Inhale 1-2 puffs into the lungs every 6 (six) hours as needed for wheezing or shortness of breath.    Dispense:  18 g    Refill:  0  . predniSONE (DELTASONE) 20 MG tablet    Sig: Take 1 tablet (20 mg total) by mouth 2 (two) times daily with a meal for 7 days.    Dispense:  14 tablet    Refill:  0    Follow-up: Return in about 1 week (around 03/10/2019), or if symptoms worsen or fail to improve.  Patient will go for Covid testing in [place] which is where he is located right now.  He will stay home and quarantine at home away from his wife and till we receive the results of his Covid test.  He will use the inhaler for wheezing and go ahead and start the prednisone 20 mg twice daily for a week.  Virtual Visit via Video Note  I connected with Chad Singh on 03/03/19 at  4:00 PM EST by a video enabled telemedicine application and verified that I am speaking with  the correct person using two identifiers.  Location: Patient: work Provider:    I discussed the limitations of evaluation and management by telemedicine and the availability of in person appointments. The patient expressed understanding and agreed to proceed.  History of Present Illness:    Observations/Objective:   Assessment and Plan:   Follow Up Instructions:    I discussed the assessment and treatment plan with the patient. The patient was provided an opportunity to ask questions and all were answered. The patient agreed with the plan and demonstrated an understanding of the instructions.   The patient was advised to call back or seek an in-person evaluation if the symptoms worsen or if the condition fails to improve as anticipated.  I provided 21 minutes of non-face-to-face time during this encounter.   Libby Maw, MD    Libby Maw, MD

## 2019-03-06 ENCOUNTER — Other Ambulatory Visit: Payer: Self-pay | Admitting: Family Medicine

## 2019-03-06 DIAGNOSIS — I10 Essential (primary) hypertension: Secondary | ICD-10-CM

## 2019-03-09 ENCOUNTER — Other Ambulatory Visit: Payer: Self-pay | Admitting: Family Medicine

## 2019-03-09 DIAGNOSIS — J301 Allergic rhinitis due to pollen: Secondary | ICD-10-CM

## 2019-03-13 ENCOUNTER — Encounter: Payer: Self-pay | Admitting: Family Medicine

## 2019-03-13 ENCOUNTER — Other Ambulatory Visit: Payer: Self-pay

## 2019-03-13 ENCOUNTER — Ambulatory Visit (INDEPENDENT_AMBULATORY_CARE_PROVIDER_SITE_OTHER): Payer: BC Managed Care – PPO | Admitting: Family Medicine

## 2019-03-13 VITALS — BP 120/80 | HR 90 | Ht 72.0 in | Wt 219.0 lb

## 2019-03-13 DIAGNOSIS — J4521 Mild intermittent asthma with (acute) exacerbation: Secondary | ICD-10-CM

## 2019-03-13 DIAGNOSIS — J22 Unspecified acute lower respiratory infection: Secondary | ICD-10-CM

## 2019-03-13 MED ORDER — PREDNISONE 10 MG (21) PO TBPK: ORAL_TABLET | ORAL | 0 refills | Status: DC

## 2019-03-13 MED ORDER — AMOXICILLIN 500 MG PO CAPS: 500.0000 mg | ORAL_CAPSULE | Freq: Three times a day (TID) | ORAL | 0 refills | Status: DC

## 2019-03-13 NOTE — Progress Notes (Signed)
Established Patient Office Visit  Subjective:  Patient ID: Chad Singh, male    DOB: 1975/11/07  Age: 43 y.o. MRN: [id number]  CC:  Chief Complaint  Patient presents with  . Follow-up    HPI Chad Singh presents for follow-up of his URI symptoms.  Symptoms are now been present for 8 days.  Cough persists and is minimally productive.  He feels as though there is phlegm that needs to be expectorated.  Continues to wheeze despite the recent prednisone therapy.  Has been using his inhaler sparingly.  Denies shortness of breath, DOE, fever or chills, loss of taste or smell. COVID-19 testing was negative.  He has been using Delsym at night.  Testing was negative.  Past Medical History:  Diagnosis Date  . Asthma   . GERD (gastroesophageal reflux disease)     Past Surgical History:  Procedure Laterality Date  . KNEE ARTHROCENTESIS    . NISSEN FUNDOPLICATION  2008    History reviewed. No pertinent family history.  Social History   Socioeconomic History  . Marital status: Married    Spouse name: Not on file  . Number of children: Not on file  . Years of education: Not on file  . Highest education level: Not on file  Occupational History  . Not on file  Social Needs  . Financial resource strain: Not on file  . Food insecurity    Worry: Not on file    Inability: Not on file  . Transportation needs    Medical: Not on file    Non-medical: Not on file  Tobacco Use  . Smoking status: Never Smoker  . Smokeless tobacco: Never Used  Substance and Sexual Activity  . Alcohol use: Yes    Comment: occasional wine with dinner   . Drug use: No  . Sexual activity: Yes    Birth control/protection: None  Lifestyle  . Physical activity    Days per week: Not on file    Minutes per session: Not on file  . Stress: Not on file  Relationships  . Social Musicianconnections    Talks on phone: Not on file    Gets together: Not on file    Attends religious service: Not on file   Active member of club or organization: Not on file    Attends meetings of clubs or organizations: Not on file    Relationship status: Not on file  . Intimate partner violence    Fear of current or ex partner: Not on file    Emotionally abused: Not on file    Physically abused: Not on file    Forced sexual activity: Not on file  Other Topics Concern  . Not on file  Social History Narrative  . Not on file    Outpatient Medications Prior to Visit  Medication Sig Dispense Refill  . albuterol (VENTOLIN HFA) 108 (90 Base) MCG/ACT inhaler Inhale 1-2 puffs into the lungs every 6 (six) hours as needed for wheezing or shortness of breath. 18 g 0  . DILT-XR 240 MG 24 hr capsule TAKE 1 CAPSULE BY MOUTH EVERY DAY 90 capsule 0  . esomeprazole (NEXIUM) 20 MG capsule Take 20 mg by mouth daily at 12 noon.    . montelukast (SINGULAIR) 10 MG tablet TAKE 1 TABLET (10 MG TOTAL) BY MOUTH AT BEDTIME. TAKE 1 TABLET BY MOUTH DAILY 90 tablet 1  . cetirizine (ZYRTEC) 10 MG tablet Take 1 tablet (10 mg total) by mouth at bedtime.  90 tablet 1   No facility-administered medications prior to visit.     Allergies  Allergen Reactions  . Codeine Nausea And Vomiting    ROS Review of Systems  Constitutional: Negative for chills, diaphoresis, fatigue, fever and unexpected weight change.  HENT: Positive for postnasal drip. Negative for congestion, sinus pressure and sinus pain.   Eyes: Negative for photophobia and visual disturbance.  Respiratory: Positive for cough and wheezing. Negative for shortness of breath.   Cardiovascular: Negative.   Gastrointestinal: Negative for diarrhea, nausea and vomiting.  Genitourinary: Negative.   Musculoskeletal: Negative for arthralgias and myalgias.  Allergic/Immunologic: Negative for immunocompromised state.  Neurological: Negative for light-headedness and headaches.  Hematological: Does not bruise/bleed easily.  Psychiatric/Behavioral: Negative.       Objective:     Physical Exam  Constitutional: He is oriented to person, place, and time. He appears well-developed and well-nourished. No distress.  HENT:  Head: Normocephalic and atraumatic.  Right Ear: External ear normal.  Left Ear: External ear normal.  Eyes: Conjunctivae are normal. Right eye exhibits no discharge. Left eye exhibits no discharge. No scleral icterus.  Neck: Neck supple. No JVD present. No tracheal deviation present. No thyromegaly present.  Cardiovascular: Normal rate, regular rhythm and normal heart sounds.  Pulmonary/Chest: Effort normal and breath sounds normal. No stridor. No respiratory distress. He has no wheezes. He has no rales.  Abdominal: Bowel sounds are normal.  Lymphadenopathy:    He has no cervical adenopathy.  Neurological: He is alert and oriented to person, place, and time.  Skin: Skin is warm and dry. He is not diaphoretic.  Psychiatric: He has a normal mood and affect. His behavior is normal.    BP 120/80   Pulse 90   Ht 6' (1.829 m)   Wt 219 lb (99.3 kg)   SpO2 95%   BMI 29.70 kg/m  Wt Readings from Last 3 Encounters:  03/13/19 219 lb (99.3 kg)  02/05/18 209 lb 6.4 oz (95 kg)  10/16/17 204 lb (92.5 kg)   BP Readings from Last 3 Encounters:  03/13/19 120/80  02/05/18 (!) 150/100  10/16/17 (!) 130/100   Guideline developer:  UpToDate (see UpToDate for funding source) Date Released: June 2014  Health Maintenance Due  Topic Date Due  . HIV Screening  01/14/1993  . TETANUS/TDAP  04/23/2010  . INFLUENZA VACCINE  11/22/2018    There are no preventive care reminders to display for this patient.  Lab Results  Component Value Date   TSH 1.18 07/10/2017   Lab Results  Component Value Date   WBC 10.6 (H) 07/10/2017   HGB 15.4 07/10/2017   HCT 45.1 07/10/2017   MCV 90.5 07/10/2017   PLT 306.0 07/10/2017   Lab Results  Component Value Date   NA 142 07/10/2017   K 4.2 07/10/2017   CO2 23 07/10/2017   GLUCOSE 109 (H) 07/10/2017   BUN 10  07/10/2017   CREATININE 1.17 07/10/2017   BILITOT 0.6 07/10/2017   AST 20 07/10/2017   ALT 27 07/10/2017   PROT 7.4 07/10/2017   CALCIUM 9.7 07/10/2017   Lab Results  Component Value Date   CHOL 185 07/10/2017   Lab Results  Component Value Date   HDL 55.00 07/10/2017   Lab Results  Component Value Date   LDLCALC 108 (H) 07/10/2017   Lab Results  Component Value Date   TRIG 111.0 07/10/2017   Lab Results  Component Value Date   CHOLHDL 3 07/10/2017   No  results found for: HGBA1C    Assessment & Plan:   Problem List Items Addressed This Visit    None    Visit Diagnoses    Lower resp. tract infection    -  Primary   Relevant Medications   predniSONE (STERAPRED UNI-PAK 21 TAB) 10 MG (21) TBPK tablet   amoxicillin (AMOXIL) 500 MG capsule   Mild intermittent reactive airway disease with acute exacerbation       Relevant Medications   predniSONE (STERAPRED UNI-PAK 21 TAB) 10 MG (21) TBPK tablet      Meds ordered this encounter  Medications  . predniSONE (STERAPRED UNI-PAK 21 TAB) 10 MG (21) TBPK tablet    Sig: Take 6 today, 5 tomorrow, 4 the next day and then 3, 2, 1 and stop    Dispense:  21 tablet    Refill:  0  . amoxicillin (AMOXIL) 500 MG capsule    Sig: Take 1 capsule (500 mg total) by mouth 3 (three) times daily.    Dispense:  30 capsule    Refill:  0    Follow-up: Return if symptoms worsen or fail to improve.   Patient will start above.  Advised him to use the inhaler for cough as well.  Continue Delsym.

## 2019-04-10 ENCOUNTER — Other Ambulatory Visit: Payer: Self-pay | Admitting: Family Medicine

## 2019-04-10 DIAGNOSIS — J4521 Mild intermittent asthma with (acute) exacerbation: Secondary | ICD-10-CM

## 2019-06-01 ENCOUNTER — Other Ambulatory Visit: Payer: Self-pay | Admitting: Family Medicine

## 2019-06-01 DIAGNOSIS — I10 Essential (primary) hypertension: Secondary | ICD-10-CM

## 2019-06-03 NOTE — Telephone Encounter (Signed)
Called pt to schedule an appointment and to inform that Rx have been refilled. No answer LMTCB  

## 2019-06-10 NOTE — Telephone Encounter (Signed)
Called pt to schedule follow up appointment on medications, no answer LMTCB.

## 2019-09-03 ENCOUNTER — Other Ambulatory Visit: Payer: Self-pay | Admitting: Family Medicine

## 2019-09-03 DIAGNOSIS — I10 Essential (primary) hypertension: Secondary | ICD-10-CM

## 2019-09-04 ENCOUNTER — Other Ambulatory Visit: Payer: Self-pay | Admitting: Family Medicine

## 2019-09-04 DIAGNOSIS — J301 Allergic rhinitis due to pollen: Secondary | ICD-10-CM

## 2019-11-26 ENCOUNTER — Other Ambulatory Visit: Payer: Self-pay | Admitting: Family Medicine

## 2019-11-26 DIAGNOSIS — J301 Allergic rhinitis due to pollen: Secondary | ICD-10-CM

## 2019-11-27 ENCOUNTER — Other Ambulatory Visit: Payer: Self-pay | Admitting: Family Medicine

## 2019-11-27 DIAGNOSIS — I10 Essential (primary) hypertension: Secondary | ICD-10-CM

## 2020-01-13 NOTE — Telephone Encounter (Signed)
Called pt to schedule appointment no answer Lm on VM to call back to schedule appointment before next refill.

## 2020-02-24 ENCOUNTER — Other Ambulatory Visit: Payer: Self-pay | Admitting: Family Medicine

## 2020-02-24 DIAGNOSIS — J301 Allergic rhinitis due to pollen: Secondary | ICD-10-CM

## 2020-02-25 ENCOUNTER — Other Ambulatory Visit: Payer: Self-pay | Admitting: Family Medicine

## 2020-02-25 DIAGNOSIS — I10 Essential (primary) hypertension: Secondary | ICD-10-CM

## 2020-03-18 NOTE — Telephone Encounter (Signed)
Called patient to schedule appointment for follow up on medications. No answer LM to call back and schedule.

## 2020-05-23 ENCOUNTER — Other Ambulatory Visit: Payer: Self-pay | Admitting: Family Medicine

## 2020-05-23 DIAGNOSIS — J301 Allergic rhinitis due to pollen: Secondary | ICD-10-CM

## 2020-05-23 DIAGNOSIS — I10 Essential (primary) hypertension: Secondary | ICD-10-CM

## 2020-05-23 NOTE — Telephone Encounter (Signed)
Last OV 03/13/19 Last fill for both medications 02/25/20 Patient need a OV.

## 2021-06-01 ENCOUNTER — Other Ambulatory Visit: Payer: Self-pay

## 2021-06-01 ENCOUNTER — Encounter: Payer: Self-pay | Admitting: Family Medicine

## 2021-06-01 ENCOUNTER — Ambulatory Visit (INDEPENDENT_AMBULATORY_CARE_PROVIDER_SITE_OTHER): Payer: BC Managed Care – PPO | Admitting: Family Medicine

## 2021-06-01 VITALS — BP 152/102 | HR 97 | Temp 97.7°F | Ht 72.0 in | Wt 221.2 lb

## 2021-06-01 DIAGNOSIS — J301 Allergic rhinitis due to pollen: Secondary | ICD-10-CM | POA: Diagnosis not present

## 2021-06-01 DIAGNOSIS — I10 Essential (primary) hypertension: Secondary | ICD-10-CM

## 2021-06-01 MED ORDER — LOSARTAN POTASSIUM 50 MG PO TABS: 50.0000 mg | ORAL_TABLET | Freq: Every day | ORAL | 1 refills | Status: DC

## 2021-06-01 MED ORDER — MONTELUKAST SODIUM 10 MG PO TABS: 10.0000 mg | ORAL_TABLET | Freq: Every day | ORAL | 1 refills | Status: DC

## 2021-06-01 NOTE — Progress Notes (Signed)
Established Patient Office Visit  Subjective:  Patient ID: Chad Singh, male    DOB: [birth year]  Age: 46 y.o. MRN: [id number]  CC:  Chief Complaint  Patient presents with   Medication Refill    Refill on medications. No concerns.     HPI [name] presents for follow-up of allergy rhinitis and hypertension.  Has been lost to follow-up for few years.  He is in need of a refill of his Singulair.  It works for him.  He has been taking it for years and tolerating it well.  He uses Zyrtec as well.  Blood pressure is better controlled at home.  His wife is been after him to come in for evaluation  Past Medical History:  Diagnosis Date   Asthma    GERD (gastroesophageal reflux disease)     Past Surgical History:  Procedure Laterality Date   KNEE ARTHROCENTESIS     NISSEN FUNDOPLICATION  2008    No family history on file.  Social History   Socioeconomic History   Marital status: Married    Spouse name: Not on file   Number of children: Not on file   Years of education: Not on file   Highest education level: Not on file  Occupational History   Not on file  Tobacco Use   Smoking status: Never   Smokeless tobacco: Never  Vaping Use   Vaping Use: Never used  Substance and Sexual Activity   Alcohol use: Yes    Comment: occasional wine with dinner    Drug use: No   Sexual activity: Yes    Birth control/protection: None  Other Topics Concern   Not on file  Social History Narrative   Not on file   Social Determinants of Health   Financial Resource Strain: Not on file  Food Insecurity: Not on file  Transportation Needs: Not on file  Physical Activity: Not on file  Stress: Not on file  Social Connections: Not on file  Intimate Partner Violence: Not on file    Outpatient Medications Prior to Visit  Medication Sig Dispense Refill   albuterol (VENTOLIN HFA) 108 (90 Base) MCG/ACT inhaler INHALE 1-2 PUFFS INTO THE LUNGS EVERY 6 (SIX) HOURS AS NEEDED FOR  WHEEZING OR SHORTNESS OF BREATH. 18 g 0   cetirizine (ZYRTEC) 10 MG tablet Take 1 tablet (10 mg total) by mouth at bedtime. 90 tablet 1   esomeprazole (NEXIUM) 20 MG capsule Take 20 mg by mouth daily at 12 noon.     montelukast (SINGULAIR) 10 MG tablet TAKE 1 TABLET (10 MG TOTAL) BY MOUTH AT BEDTIME. 90 tablet 0   amoxicillin (AMOXIL) 500 MG capsule Take 1 capsule (500 mg total) by mouth 3 (three) times daily. 30 capsule 0   DILT-XR 240 MG 24 hr capsule TAKE 1 CAPSULE BY MOUTH EVERY DAY (Patient not taking: Reported on 06/01/2021) 90 capsule 0   predniSONE (STERAPRED UNI-PAK 21 TAB) 10 MG (21) TBPK tablet Take 6 today, 5 tomorrow, 4 the next day and then 3, 2, 1 and stop 21 tablet 0   No facility-administered medications prior to visit.    Allergies  Allergen Reactions   Codeine Nausea And Vomiting    ROS Review of Systems  Constitutional:  Negative for chills, diaphoresis, fatigue, fever and unexpected weight change.  HENT:  Positive for congestion, postnasal drip and sneezing.   Eyes:  Negative for photophobia and visual disturbance.  Respiratory:  Negative for cough and wheezing.  Cardiovascular: Negative.   Gastrointestinal: Negative.   Neurological:  Negative for speech difficulty, weakness, light-headedness and headaches.     Objective:    Physical Exam Vitals and nursing note reviewed.  Constitutional:      General: He is not in acute distress.    Appearance: Normal appearance. He is not ill-appearing, toxic-appearing or diaphoretic.  HENT:     Head: Normocephalic and atraumatic.     Right Ear: Tympanic membrane, ear canal and external ear normal.     Left Ear: Tympanic membrane, ear canal and external ear normal.     Mouth/Throat:     Mouth: Mucous membranes are moist.     Pharynx: Oropharynx is clear. No oropharyngeal exudate or posterior oropharyngeal erythema.  Eyes:     General: No scleral icterus.       Right eye: No discharge.        Left eye: No discharge.      Extraocular Movements: Extraocular movements intact.     Conjunctiva/sclera: Conjunctivae normal.     Pupils: Pupils are equal, round, and reactive to light.  Cardiovascular:     Rate and Rhythm: Normal rate and regular rhythm.  Pulmonary:     Effort: Pulmonary effort is normal. No respiratory distress.     Breath sounds: Normal breath sounds. No wheezing.  Musculoskeletal:     Cervical back: No rigidity or tenderness.  Lymphadenopathy:     Cervical: No cervical adenopathy.  Skin:    General: Skin is warm and dry.  Neurological:     Mental Status: He is alert and oriented to person, place, and time.  Psychiatric:        Mood and Affect: Mood normal.        Behavior: Behavior normal.    BP (!) 152/102 (BP Location: Right Arm, Patient Position: Sitting, Cuff Size: Large)    Pulse 97    Temp 97.7 F (36.5 C) (Temporal)    Ht 6' (1.829 m)    Wt 221 lb 3.2 oz (100.3 kg)    SpO2 98%    BMI 30.00 kg/m  Wt Readings from Last 3 Encounters:  06/01/21 221 lb 3.2 oz (100.3 kg)  03/13/19 219 lb (99.3 kg)  02/05/18 209 lb 6.4 oz (95 kg)     Health Maintenance Due  Topic Date Due   HIV Screening  Never done   Hepatitis C Screening  Never done    There are no preventive care reminders to display for this patient.  Lab Results  Component Value Date   TSH 1.18 07/10/2017   Lab Results  Component Value Date   WBC 10.6 (H) 07/10/2017   HGB 15.4 07/10/2017   HCT 45.1 07/10/2017   MCV 90.5 07/10/2017   PLT 306.0 07/10/2017   Lab Results  Component Value Date   NA 142 07/10/2017   K 4.2 07/10/2017   CO2 23 07/10/2017   GLUCOSE 109 (H) 07/10/2017   BUN 10 07/10/2017   CREATININE 1.17 07/10/2017   BILITOT 0.6 07/10/2017   AST 20 07/10/2017   ALT 27 07/10/2017   PROT 7.4 07/10/2017   CALCIUM 9.7 07/10/2017   Lab Results  Component Value Date   CHOL 185 07/10/2017   Lab Results  Component Value Date   HDL 55.00 07/10/2017   Lab Results  Component Value Date   LDLCALC  108 (H) 07/10/2017   Lab Results  Component Value Date   TRIG 111.0 07/10/2017   Lab Results  Component Value  Date   CHOLHDL 3 07/10/2017   No results found for: HGBA1C    Assessment & Plan:   Problem List Items Addressed This Visit       Cardiovascular and Mediastinum   Essential hypertension - Primary   Relevant Medications   losartan (COZAAR) 50 MG tablet   Other Relevant Orders   Basic metabolic panel   CBC     Respiratory   Allergic rhinitis   Relevant Medications   montelukast (SINGULAIR) 10 MG tablet    Meds ordered this encounter  Medications   losartan (COZAAR) 50 MG tablet    Sig: Take 1 tablet (50 mg total) by mouth daily.    Dispense:  90 tablet    Refill:  1   montelukast (SINGULAIR) 10 MG tablet    Sig: Take 1 tablet (10 mg total) by mouth at bedtime.    Dispense:  90 tablet    Refill:  1    Follow-up: Return in about 2 months (around 07/30/2021).  Continue Singulair with Zyrtec for allergy rhinitis.  Information was given on Singulair and allergy rhinitis.  We will start losartan for blood pressure control.  Information was given on this medication as well as hypertension.  We discussed risk factors for untreated blood pressure including stroke and heart attack. Addendum: patient declined blood work today.   Libby Maw, MD

## 2021-08-03 ENCOUNTER — Ambulatory Visit: Payer: BC Managed Care – PPO | Admitting: Family Medicine

## 2021-08-31 ENCOUNTER — Emergency Department (HOSPITAL_BASED_OUTPATIENT_CLINIC_OR_DEPARTMENT_OTHER)
Admission: EM | Admit: 2021-08-31 | Discharge: 2021-08-31 | Disposition: A | Discharge status: Home / Self Care | Payer: BC Managed Care – PPO | Attending: Emergency Medicine | Admitting: Emergency Medicine

## 2021-08-31 ENCOUNTER — Encounter (HOSPITAL_BASED_OUTPATIENT_CLINIC_OR_DEPARTMENT_OTHER): Payer: Self-pay | Admitting: Urology

## 2021-08-31 ENCOUNTER — Emergency Department (HOSPITAL_BASED_OUTPATIENT_CLINIC_OR_DEPARTMENT_OTHER): Payer: BC Managed Care – PPO

## 2021-08-31 ENCOUNTER — Other Ambulatory Visit: Payer: Self-pay

## 2021-08-31 DIAGNOSIS — X501XXA Overexertion from prolonged static or awkward postures, initial encounter: Secondary | ICD-10-CM | POA: Diagnosis not present

## 2021-08-31 DIAGNOSIS — M7989 Other specified soft tissue disorders: Secondary | ICD-10-CM | POA: Diagnosis not present

## 2021-08-31 DIAGNOSIS — S93401A Sprain of unspecified ligament of right ankle, initial encounter: Secondary | ICD-10-CM | POA: Insufficient documentation

## 2021-08-31 DIAGNOSIS — Y9366 Activity, soccer: Secondary | ICD-10-CM | POA: Insufficient documentation

## 2021-08-31 DIAGNOSIS — S99911A Unspecified injury of right ankle, initial encounter: Secondary | ICD-10-CM | POA: Diagnosis present

## 2021-08-31 NOTE — ED Triage Notes (Signed)
Right ankle injury playing soccer approx 1 hr PTA, reports twisting it ?Swelling noted  ?Took 800mg  ibuprofen at 2000 ?

## 2021-08-31 NOTE — ED Provider Notes (Addendum)
?[facility] DEPARTMENT ?Provider Note ? ? ?CSN: [id number] ?Arrival date & time: 08/31/21  2052 ? ?  ? ?History ? ?Chief Complaint  ?Patient presents with  ? Ankle Pain  ? ? ?Chad Singh is a 46 y.o. male. ? ?Patient is a 46 year old male who presents with pain in his right ankle.  He said he twisted it while playing soccer about an hour prior to arrival.  He has had prior ankle sprains but no fractures.  He denies any other injuries.  He took ibuprofen prior to arrival. ? ? ?  ? ?Home Medications ?Prior to Admission medications   ?Medication Sig Start Date End Date Taking? Authorizing Provider  ?albuterol (VENTOLIN HFA) 108 (90 Base) MCG/ACT inhaler INHALE 1-2 PUFFS INTO THE LUNGS EVERY 6 (SIX) HOURS AS NEEDED FOR WHEEZING OR SHORTNESS OF BREATH. 04/10/19   Libby Maw, MD  ?cetirizine (ZYRTEC) 10 MG tablet Take 1 tablet (10 mg total) by mouth at bedtime. 09/09/18 06/01/21  Libby Maw, MD  ?esomeprazole (NEXIUM) 20 MG capsule Take 20 mg by mouth daily at 12 noon.    [provider]  ?losartan (COZAAR) 50 MG tablet Take 1 tablet (50 mg total) by mouth daily. 06/01/21   Libby Maw, MD  ?montelukast (SINGULAIR) 10 MG tablet Take 1 tablet (10 mg total) by mouth at bedtime. 06/01/21   Libby Maw, MD  ?   ? ?Allergies    ?Codeine   ? ?Review of Systems   ?Review of Systems  ?Constitutional:  Negative for fever.  ?Gastrointestinal:  Negative for nausea and vomiting.  ?Musculoskeletal:  Positive for arthralgias and joint swelling. Negative for back pain and neck pain.  ?Skin:  Negative for wound.  ?Neurological:  Negative for weakness, numbness and headaches.  ? ?Physical Exam ?Updated Vital Signs ?BP (!) 134/98   Pulse 90   Temp 98.1 ?F (36.7 ?C) (Oral)   Resp 20   Ht 6' (1.829 m)   Wt 100.3 kg   SpO2 96%   BMI 29.99 kg/m?  ?Physical Exam ?Constitutional:   ?   Appearance: He is well-developed.  ?HENT:  ?   Head: Normocephalic and atraumatic.   ?Cardiovascular:  ?   Rate and Rhythm: Normal rate.  ?Pulmonary:  ?   Effort: Pulmonary effort is normal.  ?Musculoskeletal:     ?   General: Tenderness present.  ?   Cervical back: Normal range of motion and neck supple.  ?   Comments: Positive swelling and tenderness over the lateral malleolus of the right ankle.  There is no pain to the foot or proximal fibula.  No open wounds.  Pedal pulses are intact.  He has normal sensation and motor function distally.  ?Skin: ?   General: Skin is warm and dry.  ?Neurological:  ?   Mental Status: He is alert and oriented to person, place, and time.  ? ? ?ED Results / Procedures / Treatments   ?Labs ?(all labs ordered are listed, but only abnormal results are displayed) ?Labs Reviewed - No data to display ? ?EKG ?None ? ?Radiology ?DG Ankle Complete Right ? ?Result Date: 08/31/2021 ?CLINICAL DATA:  Ankle injury playing soccer. EXAM: RIGHT ANKLE - COMPLETE 3+ VIEW COMPARISON:  None Available. FINDINGS: Small well corticated ossicles adjacent to the tip of the fibula likely due to chronic injury. Overlying soft tissue swelling laterally. No acute fracture.  Ankle joint space normal. IMPRESSION: No acute fracture. Small ossicles adjacent to the  tip of the fibula. Lateral soft tissue swelling. Electronically Signed   By: Franchot Gallo M.D.   On: 08/31/2021 21:41   ? ?Procedures ?Procedures  ? ? ?Medications Ordered in ED ?Medications - No data to display ? ?ED Course/ Medical Decision Making/ A&P ?  ?                        ?Medical Decision Making ?Amount and/or Complexity of Data Reviewed ?Radiology: ordered. ? ? ?Patient is a 46 year old male who presents with injury to his right ankle.  X-rays were performed which were interpreted by me.  There is no evidence of fracture.  He was placed in ASO ankle splint.  He was given crutches.  He was advised on ice and elevation.  He was advised to alternate ibuprofen and Tylenol for symptomatic relief.  He was advised to follow-up with  orthopedics if his symptoms are not improving over the next week.  He has previously been seen at [facility]. ? ?{Final Clinical Impression(s) / ED Diagnoses ?Final diagnoses:  ?Sprain of right ankle, unspecified ligament, initial encounter  ? ? ?Rx / DC Orders ?ED Discharge Orders   ? ? None  ? ?  ? ? ?  ?Malvin Johns, MD ?08/31/21 2214 ? ?  ?Malvin Johns, MD ?08/31/21 2252 ? ?

## 2021-12-01 ENCOUNTER — Other Ambulatory Visit: Payer: Self-pay | Admitting: Family Medicine

## 2021-12-01 DIAGNOSIS — J301 Allergic rhinitis due to pollen: Secondary | ICD-10-CM

## 2022-06-10 ENCOUNTER — Other Ambulatory Visit: Payer: Self-pay | Admitting: Family Medicine

## 2022-06-10 DIAGNOSIS — J301 Allergic rhinitis due to pollen: Secondary | ICD-10-CM

## 2022-07-09 ENCOUNTER — Other Ambulatory Visit: Payer: Self-pay | Admitting: Family Medicine

## 2022-07-09 DIAGNOSIS — J301 Allergic rhinitis due to pollen: Secondary | ICD-10-CM

## 2022-10-15 ENCOUNTER — Other Ambulatory Visit: Payer: Self-pay | Admitting: Family Medicine

## 2022-10-15 DIAGNOSIS — J301 Allergic rhinitis due to pollen: Secondary | ICD-10-CM

## 2023-01-16 ENCOUNTER — Ambulatory Visit: Payer: Self-pay | Admitting: General Surgery

## 2023-02-04 ENCOUNTER — Inpatient Hospital Stay (HOSPITAL_COMMUNITY): Admission: RE | Admit: 2023-02-04 | Payer: BC Managed Care – PPO | Source: Ambulatory Visit

## 2023-02-07 ENCOUNTER — Ambulatory Visit (HOSPITAL_COMMUNITY): Admit: 2023-02-07 | Payer: BC Managed Care – PPO | Admitting: General Surgery

## 2023-02-07 SURGERY — LAPAROSCOPIC VENTRAL HERNIA
Anesthesia: General

## 2024-01-01 IMAGING — DX DG ANKLE COMPLETE 3+V*R*
3 series · 3 of 3 positions shown · non-contrast
Comparison: None Available.

CLINICAL DATA: Ankle injury playing soccer.

EXAM:
RIGHT ANKLE - COMPLETE 3+ VIEW

[ankle ap]
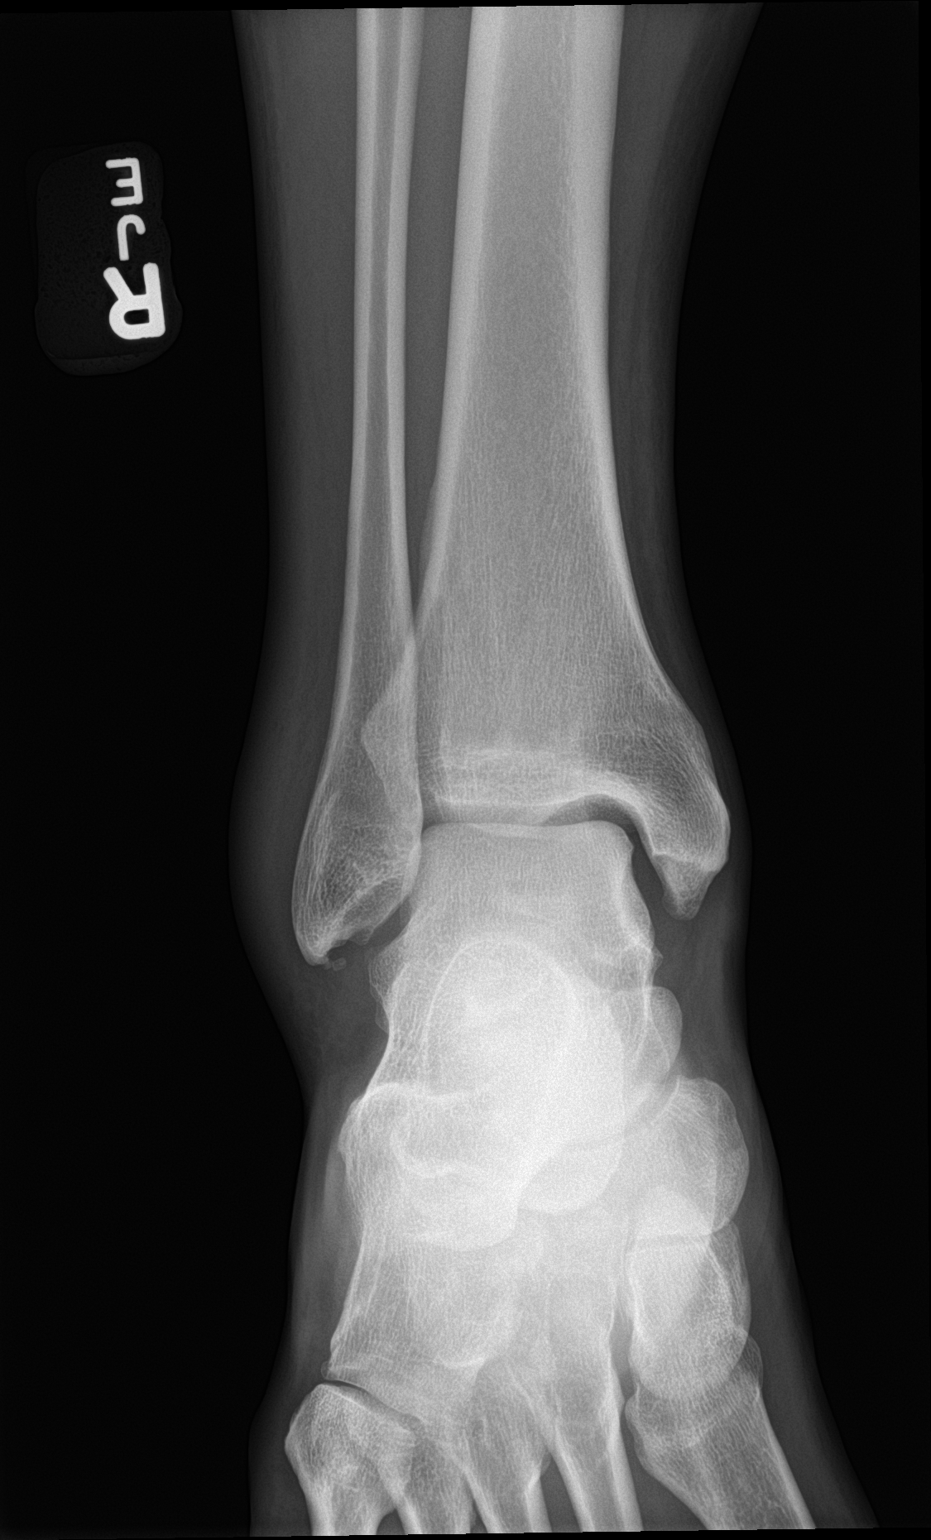

[ankle obl]
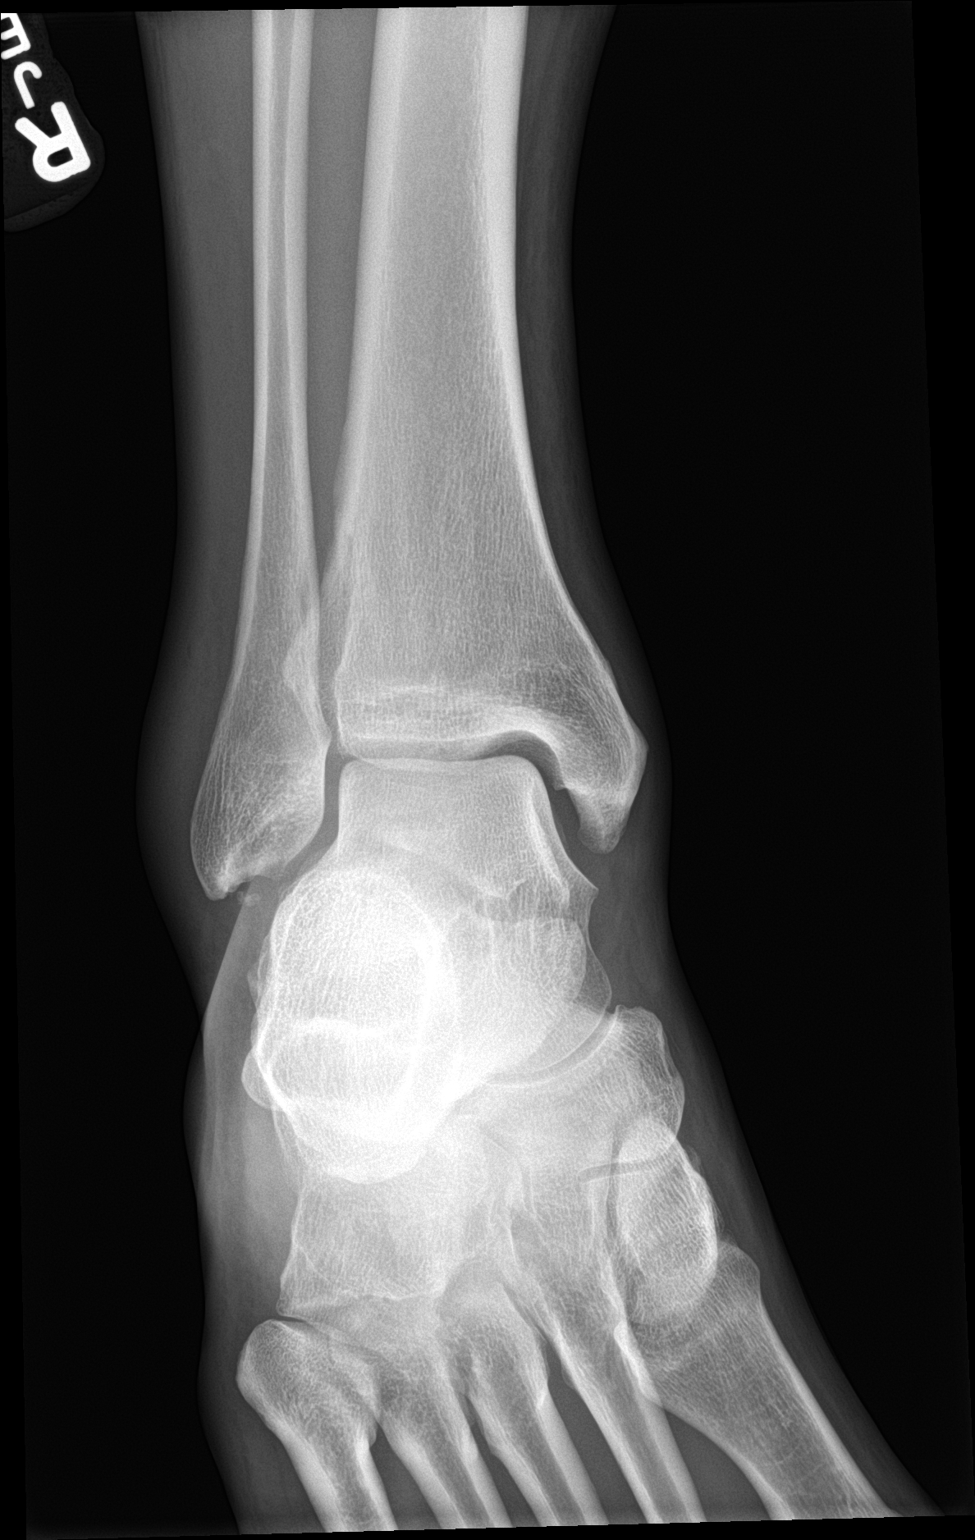

[ankle lat]
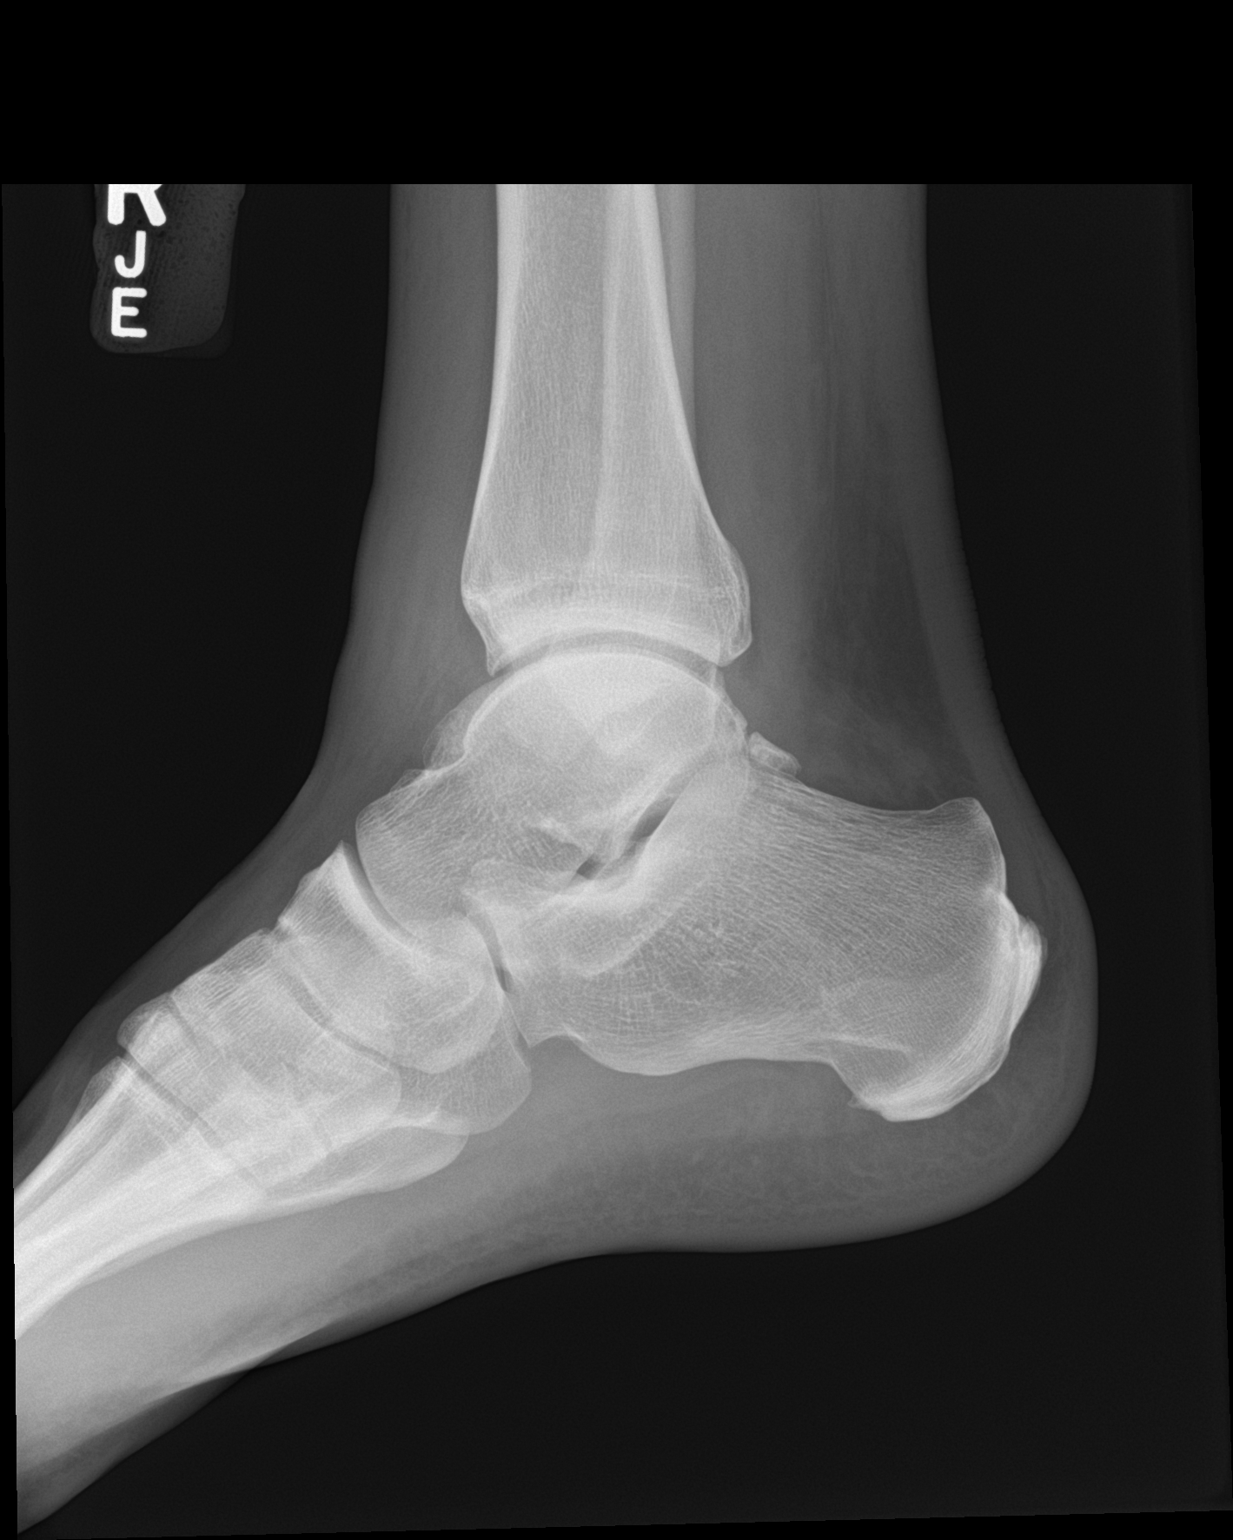

[3 of 3 positions shown; findings below may reference images not displayed]

FINDINGS: Small well corticated ossicles adjacent to the tip of the fibula
likely due to chronic injury. Overlying soft tissue swelling
laterally.

No acute fracture.  Ankle joint space normal.
IMPRESSION: No acute fracture. Small ossicles adjacent to the tip of the fibula.
Lateral soft tissue swelling.

## 2024-03-02 NOTE — Progress Notes (Signed)
 Surgical preop orders have been requested.

## 2024-03-02 NOTE — Progress Notes (Signed)
 Surgical Instructions   Your procedure is scheduled on Thusday, November 13th. Report to [facility] Main Entrance A at 7:30 A.M., then check in with the Admitting office. Any questions or running late day of surgery: call [phone]  Questions prior to your surgery date: call [phone], Monday-Friday, 8am-4pm. If you experience any cold or flu symptoms such as cough, fever, chills, shortness of breath, etc. between now and your scheduled surgery, please notify us  at the above number.     Remember:  Do not eat after midnight the night before your surgery   You may drink clear liquids until 6:30 the morning of your surgery.   Clear liquids allowed are: Water, Non-Citrus Juices (without pulp), Carbonated Beverages, Clear Tea (no milk, honey, etc.), Black Coffee Only (NO MILK, CREAM OR POWDERED CREAMER of any kind), and Gatorade.    Take these medicines the morning of surgery with A SIP OF WATER: NONE   May take these medicines IF NEEDED: NONE  One week prior to surgery, STOP taking any Aspirin (unless otherwise instructed by your surgeon) Aleve, Naproxen, Ibuprofen, Motrin, Advil, Goody's, BC's, all herbal medications, fish oil, and non-prescription vitamins.                     Do NOT Smoke (Tobacco/Vaping) for 24 hours prior to your procedure.  If you use a CPAP at night, you may bring your mask/headgear for your overnight stay.   You will be asked to remove any contacts, glasses, piercing's, hearing aid's, dentures/partials prior to surgery. Please bring cases for these items if needed.    Patients discharged the day of surgery will not be allowed to drive home, and someone needs to stay with them for 24 hours.  SURGICAL WAITING ROOM VISITATION Patients may have no more than 2 support people in the waiting area - these visitors may rotate.   Pre-op nurse will coordinate an appropriate time for 1 ADULT support person, who may not rotate, to accompany patient in pre-op.   Children under the age of 33 must have an adult with them who is not the patient and must remain in the main waiting area with an adult.  If the patient needs to stay at the hospital during part of their recovery, the visitor guidelines for inpatient rooms apply.  Please refer to the [facility] website for the visitor guidelines for any additional information.   If you received a COVID test during your pre-op visit  it is requested that you wear a mask when out in public, stay away from anyone that may not be feeling well and notify your surgeon if you develop symptoms. If you have been in contact with anyone that has tested positive in the last 10 days please notify you surgeon.      Pre-operative CHG Bathing Instructions   You can play a key role in reducing the risk of infection after surgery. Your skin needs to be as free of germs as possible. You can reduce the number of germs on your skin by washing with CHG (chlorhexidine gluconate) soap before surgery. CHG is an antiseptic soap that kills germs and continues to kill germs even after washing.   DO NOT use if you have an allergy to chlorhexidine/CHG or antibacterial soaps. If your skin becomes reddened or irritated, stop using the CHG and notify one of our RNs at [phone].              TAKE A SHOWER THE NIGHT  BEFORE SURGERY   Please keep in mind the following:  You may shave your face before/day of surgery.  Place clean sheets on your bed the night before surgery Use a clean washcloth (not used since being washed) for shower. DO NOT sleep with pet's night before surgery.  CHG Shower Instructions:  Wash your face and private area with normal soap. If you choose to wash your hair, wash first with your normal shampoo.  After you use shampoo/soap, rinse your hair and body thoroughly to remove shampoo/soap residue.  Turn the water OFF and apply half the bottle of CHG soap to a CLEAN washcloth.  Apply CHG soap ONLY FROM YOUR NECK  DOWN TO YOUR TOES (washing for 3-5 minutes)  DO NOT use CHG soap on face, private areas, open wounds, or sores.  Pay special attention to the area where your surgery is being performed.  If you are having back surgery, having someone wash your back for you may be helpful. Wait 2 minutes after CHG soap is applied, then you may rinse off the CHG soap.  Pat dry with a clean towel  Put on clean pajamas    Additional instructions for the day of surgery: If you choose, you may shower the morning of surgery with an antibacterial soap.  DO NOT APPLY any lotions, deodorants, or cologne.   Do not wear jewelry Do not bring valuables to the hospital. [facility] is not responsible for valuables/personal belongings. Put on clean/comfortable clothes.  Please brush your teeth.  Ask your nurse before applying any prescription medications to the skin.

## 2024-03-03 ENCOUNTER — Encounter (HOSPITAL_COMMUNITY)
Admission: RE | Admit: 2024-03-03 | Discharge: 2024-03-03 | Disposition: A | Discharge status: Home / Self Care | Source: Ambulatory Visit | Attending: General Surgery | Admitting: General Surgery

## 2024-03-03 ENCOUNTER — Ambulatory Visit: Payer: Self-pay | Admitting: General Surgery

## 2024-03-03 ENCOUNTER — Encounter (HOSPITAL_COMMUNITY): Payer: Self-pay

## 2024-03-03 ENCOUNTER — Other Ambulatory Visit: Payer: Self-pay

## 2024-03-03 VITALS — BP 145/105 | HR 97 | Temp 97.6°F | Resp 18 | Ht 72.0 in | Wt 190.0 lb

## 2024-03-03 DIAGNOSIS — Z01818 Encounter for other preprocedural examination: Secondary | ICD-10-CM | POA: Insufficient documentation

## 2024-03-03 DIAGNOSIS — J45909 Unspecified asthma, uncomplicated: Secondary | ICD-10-CM | POA: Diagnosis not present

## 2024-03-03 DIAGNOSIS — F419 Anxiety disorder, unspecified: Secondary | ICD-10-CM | POA: Diagnosis not present

## 2024-03-03 DIAGNOSIS — Z79899 Other long term (current) drug therapy: Secondary | ICD-10-CM | POA: Diagnosis not present

## 2024-03-03 DIAGNOSIS — K439 Ventral hernia without obstruction or gangrene: Secondary | ICD-10-CM | POA: Diagnosis present

## 2024-03-03 DIAGNOSIS — K219 Gastro-esophageal reflux disease without esophagitis: Secondary | ICD-10-CM | POA: Diagnosis not present

## 2024-03-03 HISTORY — DX: Anxiety disorder, unspecified: F41.9

## 2024-03-03 NOTE — Pre-Procedure Instructions (Signed)
 Surgical Instructions     Your procedure is scheduled on Thusday, November 13th. Report to [facility] Main Entrance A at 08:30 A.M., then check in with the Admitting office. Any questions or running late day of surgery: call [phone]   Questions prior to your surgery date: call [phone], Monday-Friday, 8am-4pm. If you experience any cold or flu symptoms such as cough, fever, chills, shortness of breath, etc. between now and your scheduled surgery, please notify us  at the above number.            Remember:       Do not eat after midnight the night before your surgery     You may drink clear liquids until 7:30 the morning of your surgery.   Clear liquids allowed are: Water, Non-Citrus Juices (without pulp), Carbonated Beverages, Clear Tea (no milk, honey, etc.), Black Coffee Only (NO MILK, CREAM OR POWDERED CREAMER of any kind), and Gatorade.  Patient Instructions  The night before surgery:  No food after midnight. ONLY clear liquids after midnight  The day of surgery (if you do NOT have diabetes):  Drink ONE (1) Pre-Surgery Clear Ensure by 0730 the morning of surgery. Drink in one sitting. Do not sip.  This drink was given to you during your hospital  pre-op appointment visit.  Nothing else to drink after completing the  Pre-Surgery Clear Ensure.          If you have questions, please contact your surgeon's office.           Take these medicines the morning of surgery with A SIP OF WATER: cetirizine  (ZYRTEC )                                                                                                                 esomeprazole (NEXIUM)       One week prior to surgery, STOP taking any Aspirin (unless otherwise instructed by your surgeon) Aleve, Naproxen, Ibuprofen, Motrin, Advil, Goody's, BC's, all herbal medications, fish oil, and non-prescription vitamins.                     Do NOT Smoke (Tobacco/Vaping) for 24 hours prior to your procedure.   If you use a  CPAP at night, you may bring your mask/headgear for your overnight stay.   You will be asked to remove any contacts, glasses, piercing's, hearing aid's, dentures/partials prior to surgery. Please bring cases for these items if needed.    Patients discharged the day of surgery will not be allowed to drive home, and someone needs to stay with them for 24 hours.   SURGICAL WAITING ROOM VISITATION Patients may have no more than 2 support people in the waiting area - these visitors may rotate.   Pre-op nurse will coordinate an appropriate time for 1 ADULT support person, who may not rotate, to accompany patient in pre-op.  Children under the age of 12 must have an adult with them who is not the patient and must remain in the main  waiting area with an adult.   If the patient needs to stay at the hospital during part of their recovery, the visitor guidelines for inpatient rooms apply.   Please refer to the [facility] website for the visitor guidelines for any additional information.     If you received a COVID test during your pre-op visit  it is requested that you wear a mask when out in public, stay away from anyone that may not be feeling well and notify your surgeon if you develop symptoms. If you have been in contact with anyone that has tested positive in the last 10 days please notify you surgeon.         Pre-operative CHG Bathing Instructions    You can play a key role in reducing the risk of infection after surgery. Your skin needs to be as free of germs as possible. You can reduce the number of germs on your skin by washing with CHG (chlorhexidine gluconate) soap before surgery. CHG is an antiseptic soap that kills germs and continues to kill germs even after washing.    DO NOT use if you have an allergy to chlorhexidine/CHG or antibacterial soaps. If your skin becomes reddened or irritated, stop using the CHG and notify one of our RNs at [phone].               TAKE A SHOWER THE  NIGHT BEFORE SURGERY    Please keep in mind the following:  You may shave your face before/day of surgery.  Place clean sheets on your bed the night before surgery Use a clean washcloth (not used since being washed) for shower. DO NOT sleep with pet's night before surgery.   CHG Shower Instructions:  Wash your face and private area with normal soap. If you choose to wash your hair, wash first with your normal shampoo.  After you use shampoo/soap, rinse your hair and body thoroughly to remove shampoo/soap residue.  Turn the water OFF and apply half the bottle of CHG soap to a CLEAN washcloth.  Apply CHG soap ONLY FROM YOUR NECK DOWN TO YOUR TOES (washing for 3-5 minutes)  DO NOT use CHG soap on face, private areas, open wounds, or sores.  Pay special attention to the area where your surgery is being performed.  If you are having back surgery, having someone wash your back for you may be helpful. Wait 2 minutes after CHG soap is applied, then you may rinse off the CHG soap.  Pat dry with a clean towel  Put on clean pajamas     Additional instructions for the day of surgery: If you choose, you may shower the morning of surgery with an antibacterial soap.  DO NOT APPLY any lotions, deodorants, or cologne.   Do not wear jewelry Do not bring valuables to the hospital. [facility] is not responsible for valuables/personal belongings. Put on clean/comfortable clothes.  Please brush your teeth.  Ask your nurse before applying any prescription medications to the skin.

## 2024-03-03 NOTE — Progress Notes (Addendum)
 PCP - denies Cardiologist - denies  PPM/ICD - denies Device Orders - n/a Rep Notified - n/a  Chest x-ray - denies EKG - denies Stress Test - denies ECHO - denies Cardiac Cath -denies   Sleep Study - denies CPAP - n/a  Fasting Blood Sugar - no DM Checks Blood Sugar _____ times a day  Last dose of GLP1 agonist-  n/a GLP1 instructions: n/a  Blood Thinner Instructions: n/a Aspirin Instructions: n/a  ERAS Protcol - yes PRE-SURGERY Ensure or G2- ensure  COVID TEST- n/a   Anesthesia review: no  Patient denies shortness of breath, fever, cough and chest pain at PAT appointment. Pt is extremely anxious about blood work. Per [place], it is ok to to draw CBC on the DOS.    All instructions explained to the patient, with a verbal understanding of the material. Patient agrees to go over the instructions while at home for a better understanding. Patient also instructed to self quarantine after being tested for COVID-19. The opportunity to ask questions was provided.

## 2024-03-05 ENCOUNTER — Ambulatory Visit (HOSPITAL_COMMUNITY): Admitting: Anesthesiology

## 2024-03-05 ENCOUNTER — Ambulatory Visit (HOSPITAL_COMMUNITY): Payer: Self-pay | Admitting: Physician Assistant

## 2024-03-05 ENCOUNTER — Other Ambulatory Visit: Payer: Self-pay

## 2024-03-05 ENCOUNTER — Encounter (HOSPITAL_COMMUNITY): Admission: RE | Disposition: A | Payer: Self-pay | Source: Home / Self Care | Attending: General Surgery

## 2024-03-05 ENCOUNTER — Encounter (HOSPITAL_COMMUNITY): Payer: Self-pay | Admitting: General Surgery

## 2024-03-05 ENCOUNTER — Ambulatory Visit (HOSPITAL_COMMUNITY)
Admission: RE | Admit: 2024-03-05 | Discharge: 2024-03-05 | Disposition: A | Discharge status: Home / Self Care | Attending: General Surgery | Admitting: General Surgery

## 2024-03-05 DIAGNOSIS — K219 Gastro-esophageal reflux disease without esophagitis: Secondary | ICD-10-CM | POA: Insufficient documentation

## 2024-03-05 DIAGNOSIS — K439 Ventral hernia without obstruction or gangrene: Secondary | ICD-10-CM | POA: Insufficient documentation

## 2024-03-05 DIAGNOSIS — F419 Anxiety disorder, unspecified: Secondary | ICD-10-CM | POA: Insufficient documentation

## 2024-03-05 DIAGNOSIS — J45909 Unspecified asthma, uncomplicated: Secondary | ICD-10-CM | POA: Insufficient documentation

## 2024-03-05 DIAGNOSIS — Z79899 Other long term (current) drug therapy: Secondary | ICD-10-CM | POA: Insufficient documentation

## 2024-03-05 HISTORY — PX: VENTRAL HERNIA REPAIR: SHX424

## 2024-03-05 LAB — CBC
HCT: 44.1 % (ref 39.0–52.0)
Hemoglobin: 15.4 g/dL (ref 13.0–17.0)
MCH: 31.4 pg (ref 26.0–34.0)
MCHC: 34.9 g/dL (ref 30.0–36.0)
MCV: 89.8 fL (ref 80.0–100.0)
Platelets: 260 K/uL (ref 150–400)
RBC: 4.91 MIL/uL (ref 4.22–5.81)
RDW: 13.2 % (ref 11.5–15.5)
WBC: 8.6 K/uL (ref 4.0–10.5)
nRBC: 0 % (ref 0.0–0.2)

## 2024-03-05 SURGERY — REPAIR, HERNIA, VENTRAL, LAPAROSCOPIC
Anesthesia: General

## 2024-03-05 MED ORDER — CHLORHEXIDINE GLUCONATE CLOTH 2 % EX PADS: 6.0000 | MEDICATED_PAD | Freq: Once | CUTANEOUS | Status: DC

## 2024-03-05 MED ORDER — PROPOFOL 10 MG/ML IV BOLUS
INTRAVENOUS | Status: AC
  Filled 2024-03-05: qty 20

## 2024-03-05 MED ORDER — FENTANYL CITRATE (PF) 250 MCG/5ML IJ SOLN
INTRAMUSCULAR | Status: DC | PRN
  Administered 2024-03-05: 100 ug via INTRAVENOUS
  Administered 2024-03-05 (×3): 50 ug via INTRAVENOUS

## 2024-03-05 MED ORDER — FENTANYL CITRATE (PF) 250 MCG/5ML IJ SOLN
INTRAMUSCULAR | Status: AC
  Filled 2024-03-05: qty 5

## 2024-03-05 MED ORDER — MIDAZOLAM HCL 2 MG/2ML IJ SOLN
INTRAMUSCULAR | Status: AC
  Filled 2024-03-05: qty 2

## 2024-03-05 MED ORDER — OXYCODONE-ACETAMINOPHEN 5-325 MG PO TABS: 1.0000 | ORAL_TABLET | ORAL | 0 refills | Status: AC | PRN

## 2024-03-05 MED ORDER — LACTATED RINGERS IV SOLN: INTRAVENOUS | Status: DC

## 2024-03-05 MED ORDER — HYDROMORPHONE HCL 1 MG/ML IJ SOLN
0.2500 mg | INTRAMUSCULAR | Status: DC | PRN
  Administered 2024-03-05 (×4): 0.5 mg via INTRAVENOUS

## 2024-03-05 MED ORDER — DEXAMETHASONE SOD PHOSPHATE PF 10 MG/ML IJ SOLN
INTRAMUSCULAR | Status: DC | PRN
  Administered 2024-03-05: 10 mg via INTRAVENOUS

## 2024-03-05 MED ORDER — ONDANSETRON HCL 4 MG/2ML IJ SOLN
INTRAMUSCULAR | Status: DC | PRN
Start: 2024-03-05 — End: 2024-03-05
  Administered 2024-03-05: 4 mg via INTRAVENOUS

## 2024-03-05 MED ORDER — ENSURE PRE-SURGERY PO LIQD: 296.0000 mL | Freq: Once | ORAL | Status: DC

## 2024-03-05 MED ORDER — 0.9 % SODIUM CHLORIDE (POUR BTL) OPTIME
TOPICAL | Status: DC | PRN
  Administered 2024-03-05: 1000 mL

## 2024-03-05 MED ORDER — CHLORHEXIDINE GLUCONATE 0.12 % MT SOLN
15.0000 mL | Freq: Once | OROMUCOSAL | Status: AC
  Administered 2024-03-05: 15 mL via OROMUCOSAL
  Filled 2024-03-05: qty 15

## 2024-03-05 MED ORDER — ONDANSETRON HCL 4 MG/2ML IJ SOLN
INTRAMUSCULAR | Status: AC
  Filled 2024-03-05: qty 2

## 2024-03-05 MED ORDER — BUPIVACAINE HCL 0.25 % IJ SOLN
INTRAMUSCULAR | Status: DC | PRN
  Administered 2024-03-05: 9 mL

## 2024-03-05 MED ORDER — ROCURONIUM BROMIDE 10 MG/ML (PF) SYRINGE
PREFILLED_SYRINGE | INTRAVENOUS | Status: DC | PRN
  Administered 2024-03-05: 60 mg via INTRAVENOUS
  Administered 2024-03-05: 20 mg via INTRAVENOUS

## 2024-03-05 MED ORDER — SODIUM CHLORIDE 0.9 % IR SOLN
Status: DC | PRN
  Administered 2024-03-05: 1000 mL

## 2024-03-05 MED ORDER — HYDROMORPHONE HCL 1 MG/ML IJ SOLN
INTRAMUSCULAR | Status: AC
  Filled 2024-03-05: qty 1

## 2024-03-05 MED ORDER — LIDOCAINE 2% (20 MG/ML) 5 ML SYRINGE
INTRAMUSCULAR | Status: AC
Start: 2024-03-05 — End: 2024-03-05
  Filled 2024-03-05: qty 5

## 2024-03-05 MED ORDER — CEFAZOLIN SODIUM-DEXTROSE 2-4 GM/100ML-% IV SOLN
2.0000 g | INTRAVENOUS | Status: AC
  Administered 2024-03-05: 2 g via INTRAVENOUS
  Filled 2024-03-05: qty 100

## 2024-03-05 MED ORDER — SUGAMMADEX SODIUM 200 MG/2ML IV SOLN
INTRAVENOUS | Status: DC | PRN
  Administered 2024-03-05: 335.6 mg via INTRAVENOUS

## 2024-03-05 MED ORDER — OXYCODONE HCL 5 MG/5ML PO SOLN
5.0000 mg | Freq: Once | ORAL | Status: AC | PRN
  Administered 2024-03-05: 5 mg via ORAL

## 2024-03-05 MED ORDER — MIDAZOLAM HCL (PF) 2 MG/2ML IJ SOLN
1.0000 mg | Freq: Once | INTRAMUSCULAR | Status: AC
  Administered 2024-03-05: 1 mg via INTRAVENOUS

## 2024-03-05 MED ORDER — ORAL CARE MOUTH RINSE
15.0000 mL | Freq: Once | OROMUCOSAL | Status: AC
Start: 2024-03-05 — End: 2024-03-05

## 2024-03-05 MED ORDER — ROCURONIUM BROMIDE 10 MG/ML (PF) SYRINGE
PREFILLED_SYRINGE | INTRAVENOUS | Status: AC
  Filled 2024-03-05: qty 10

## 2024-03-05 MED ORDER — MEPERIDINE HCL 25 MG/ML IJ SOLN: 6.2500 mg | INTRAMUSCULAR | Status: DC | PRN

## 2024-03-05 MED ORDER — BUPIVACAINE HCL (PF) 0.25 % IJ SOLN
INTRAMUSCULAR | Status: AC
  Filled 2024-03-05: qty 30

## 2024-03-05 MED ORDER — OXYCODONE HCL 5 MG/5ML PO SOLN
ORAL | Status: AC
  Filled 2024-03-05: qty 5

## 2024-03-05 MED ORDER — LIDOCAINE 2% (20 MG/ML) 5 ML SYRINGE
INTRAMUSCULAR | Status: DC | PRN
  Administered 2024-03-05: 50 mg via INTRAVENOUS

## 2024-03-05 MED ORDER — ACETAMINOPHEN 500 MG PO TABS
1000.0000 mg | ORAL_TABLET | ORAL | Status: AC
  Administered 2024-03-05: 1000 mg via ORAL
  Filled 2024-03-05: qty 2

## 2024-03-05 MED ORDER — OXYCODONE HCL 5 MG PO TABS: 5.0000 mg | ORAL_TABLET | Freq: Once | ORAL | Status: AC | PRN

## 2024-03-05 MED ORDER — MIDAZOLAM HCL (PF) 2 MG/2ML IJ SOLN
INTRAMUSCULAR | Status: DC | PRN
  Administered 2024-03-05: 2 mg via INTRAVENOUS

## 2024-03-05 MED ORDER — PROPOFOL 10 MG/ML IV BOLUS
INTRAVENOUS | Status: DC | PRN
Start: 2024-03-05 — End: 2024-03-05
  Administered 2024-03-05: 200 mg via INTRAVENOUS

## 2024-03-05 MED ORDER — MIDAZOLAM HCL (PF) 2 MG/2ML IJ SOLN: 0.5000 mg | Freq: Once | INTRAMUSCULAR | Status: DC | PRN

## 2024-03-05 SURGICAL SUPPLY — 38 items
BAG COUNTER SPONGE SURGICOUNT (BAG) ×1 IMPLANT
CANISTER SUCTION 3000ML PPV (SUCTIONS) IMPLANT
CHLORAPREP W/TINT 26 (MISCELLANEOUS) ×1 IMPLANT
CLIP APPLIE 5 13 M/L LIGAMAX5 (MISCELLANEOUS) IMPLANT
COVER SURGICAL LIGHT HANDLE (MISCELLANEOUS) ×1 IMPLANT
DERMABOND ADVANCED .7 DNX12 (GAUZE/BANDAGES/DRESSINGS) ×1 IMPLANT
DEVICE SECURE STRAP 25 ABSORB (INSTRUMENTS) ×1 IMPLANT
DEVICE TROCAR PUNCTURE CLOSURE (ENDOMECHANICALS) ×1 IMPLANT
ELECTRODE REM PT RTRN 9FT ADLT (ELECTROSURGICAL) ×1 IMPLANT
GLOVE BIO SURGEON STRL SZ7.5 (GLOVE) ×2 IMPLANT
GOWN STRL REUS W/ TWL LRG LVL3 (GOWN DISPOSABLE) ×2 IMPLANT
GOWN STRL REUS W/ TWL XL LVL3 (GOWN DISPOSABLE) ×1 IMPLANT
IRRIGATION SUCT STRKRFLW 2 WTP (MISCELLANEOUS) IMPLANT
KIT BASIN OR (CUSTOM PROCEDURE TRAY) ×1 IMPLANT
KIT TURNOVER KIT B (KITS) ×1 IMPLANT
MESH VENTRALIGHT ST 4X6IN (Mesh General) IMPLANT
NDL INSUFFLATION 14GA 120MM (NEEDLE) ×1 IMPLANT
NEEDLE INSUFFLATION 14GA 120MM (NEEDLE) ×1 IMPLANT
PAD ARMBOARD POSITIONER FOAM (MISCELLANEOUS) ×2 IMPLANT
POUCH LAPAROSCOPIC INSTRUMENT (MISCELLANEOUS) ×1 IMPLANT
SCISSORS LAP 5X35 DISP (ENDOMECHANICALS) ×1 IMPLANT
SET TUBE SMOKE EVAC HIGH FLOW (TUBING) ×1 IMPLANT
SHEARS HARMONIC 36 ACE (MISCELLANEOUS) IMPLANT
SLEEVE Z-THREAD 5X100MM (TROCAR) ×1 IMPLANT
SOLN 0.9% NACL POUR BTL 1000ML (IV SOLUTION) ×1 IMPLANT
SOLN STERILE WATER BTL 1000 ML (IV SOLUTION) ×1 IMPLANT
SUT CHROMIC 2 0 SH (SUTURE) ×1 IMPLANT
SUT ETHIBOND 0 MO6 C/R (SUTURE) IMPLANT
SUT MNCRL AB 4-0 PS2 18 (SUTURE) ×1 IMPLANT
SUT NOVA 1 T20/GS 25DT (SUTURE) IMPLANT
SUT PROLENE 2 0 KS (SUTURE) IMPLANT
TOWEL GREEN STERILE (TOWEL DISPOSABLE) ×1 IMPLANT
TOWEL GREEN STERILE FF (TOWEL DISPOSABLE) ×1 IMPLANT
TRAY FOLEY W/BAG SLVR 16FR ST (SET/KITS/TRAYS/PACK) IMPLANT
TRAY LAPAROSCOPIC MC (CUSTOM PROCEDURE TRAY) ×1 IMPLANT
TROCAR 11X100 Z THREAD (TROCAR) IMPLANT
TROCAR Z-THREAD OPTICAL 5X100M (TROCAR) ×1 IMPLANT
WARMER LAPAROSCOPE (MISCELLANEOUS) ×1 IMPLANT

## 2024-03-05 NOTE — Anesthesia Procedure Notes (Signed)
 Procedure Name: Intubation Date/Time: 03/05/2024 11:22 AM  Performed by: Julien Manus, CRNAPre-anesthesia Checklist: Patient identified, Emergency Drugs available, Suction available and Patient being monitored Patient Re-evaluated:Patient Re-evaluated prior to induction Oxygen Delivery Method: Circle System Utilized Preoxygenation: Pre-oxygenation with 100% oxygen Induction Type: IV induction Ventilation: Mask ventilation without difficulty Laryngoscope Size: Glidescope and 4 Grade View: Grade I Tube type: Oral Tube size: 7.5 mm Number of attempts: 1 Airway Equipment and Method: Stylet and Oral airway Placement Confirmation: ETT inserted through vocal cords under direct vision, positive ETCO2 and breath sounds checked- equal and bilateral Secured at: 23 cm Tube secured with: Tape Dental Injury: Teeth and Oropharynx as per pre-operative assessment

## 2024-03-05 NOTE — Transfer of Care (Signed)
 Immediate Anesthesia Transfer of Care Note  Patient: Chad Singh  Procedure(s) Performed: LAPAROSCOPIC VENTRAL HERNIA REPAIR WITH MESH  Patient Location: PACU  Anesthesia Type:General  Level of Consciousness: awake and alert   Airway & Oxygen Therapy: Patient Spontanous Breathing and Patient connected to nasal cannula oxygen  Post-op Assessment: Report given to RN and Post -op Vital signs reviewed and stable  Post vital signs: Reviewed and stable  Last Vitals:  Vitals Value Taken Time  BP 156/109 03/05/24 12:03  Temp    Pulse 91 03/05/24 12:08  Resp 6 03/05/24 12:08  SpO2 96 % 03/05/24 12:08  Vitals shown include unfiled device data.  Last Pain:  Vitals:   03/05/24 0841  TempSrc: Oral  PainSc:          Complications: No notable events documented.

## 2024-03-05 NOTE — Anesthesia Postprocedure Evaluation (Signed)
 Anesthesia Post Note  Patient: Chad Singh  Procedure(s) Performed: LAPAROSCOPIC VENTRAL HERNIA REPAIR WITH MESH     Patient location during evaluation: PACU Anesthesia Type: General Level of consciousness: awake and alert, patient cooperative and oriented Pain management: pain level controlled Vital Signs Assessment: post-procedure vital signs reviewed and stable Respiratory status: spontaneous breathing, nonlabored ventilation and respiratory function stable Cardiovascular status: blood pressure returned to baseline and stable Postop Assessment: no apparent nausea or vomiting and able to ambulate Anesthetic complications: no   No notable events documented.  Last Vitals:  Vitals:   03/05/24 1300 03/05/24 1315  BP: (!) 145/108 (!) 144/107  Pulse: 86 84  Resp: 10 10  Temp: 36.5 C 36.5 C  SpO2: 93% 95%    Last Pain:  Vitals:   03/05/24 1252  TempSrc:   PainSc: 5                  Whitley Strycharz,E. Olla Delancey

## 2024-03-05 NOTE — Anesthesia Preprocedure Evaluation (Addendum)
 Anesthesia Evaluation  Patient identified by MRN, date of birth, ID band Patient awake    Reviewed: Allergy & Precautions, NPO status , Patient's Chart, lab work & pertinent test results  History of Anesthesia Complications Negative for: history of anesthetic complications  Airway Mallampati: III  TM Distance: >3 FB Neck ROM: Full    Dental  (+) Dental Advisory Given   Pulmonary asthma    breath sounds clear to auscultation       Cardiovascular negative cardio ROS  Rhythm:Regular Rate:Normal     Neuro/Psych   Anxiety     negative neurological ROS     GI/Hepatic Neg liver ROS,GERD  Controlled,,S/p [employer]   Endo/Other  negative endocrine ROS    Renal/GU negative Renal ROS     Musculoskeletal   Abdominal   Peds  Hematology   Anesthesia Other Findings   Reproductive/Obstetrics                              Anesthesia Physical Anesthesia Plan  ASA: 2  Anesthesia Plan: General   Post-op Pain Management: Tylenol PO (pre-op)*   Induction: Intravenous  PONV Risk Score and Plan: 2 and Ondansetron and Dexamethasone  Airway Management Planned: Oral ETT and Video Laryngoscope Planned  Additional Equipment: None  Intra-op Plan:   Post-operative Plan: Extubation in OR  Informed Consent: I have reviewed the patients History and Physical, chart, labs and discussed the procedure including the risks, benefits and alternatives for the proposed anesthesia with the patient or authorized representative who has indicated his/her understanding and acceptance.     Dental advisory given  Plan Discussed with: Surgeon and CRNA  Anesthesia Plan Comments:          Anesthesia Quick Evaluation

## 2024-03-05 NOTE — H&P (Signed)
 Chief Complaint: NEW PROBLEM (EVAL. HERNIA)       History of Present Illness: Chad Singh is a 48 y.o. male who is seen today as an office consultation at the request of Dr. Arch for evaluation of NEW PROBLEM (EVAL. HERNIA) .   Patient is a 48 year old male who comes in secondary to a ventral hernia.  Patient states this occurred approximate week ago.  States he was at work and lifting a male COVID.  He does do heavy lifting at work. Patient states that he did feel a bulge there.  Is fairly sharp.   He had a previous laparoscopic Nissen x 2. There appears to be no incision in the area of the hernia.         Review of Systems: A complete review of systems was obtained from the patient.  I have reviewed this information and discussed as appropriate with the patient.  See HPI as well for other ROS.   Review of Systems  Constitutional:  Negative for fever.  HENT:  Negative for congestion.   Eyes:  Negative for blurred vision.  Respiratory:  Negative for cough, shortness of breath and wheezing.   Cardiovascular:  Negative for chest pain and palpitations.  Gastrointestinal:  Negative for heartburn.  Genitourinary:  Negative for dysuria.  Musculoskeletal:  Negative for myalgias.  Skin:  Negative for rash.  Neurological:  Negative for dizziness and headaches.  Psychiatric/Behavioral:  Negative for depression and suicidal ideas.   All other systems reviewed and are negative.       Medical History:     Past Medical History:  Diagnosis Date   Asthma, unspecified asthma severity, unspecified whether complicated, unspecified whether persistent ([facility])     GERD (gastroesophageal reflux disease)        There is no problem list on file for this patient.          Past Surgical History:  Procedure Laterality Date   ESOPHAGOGASTRIC FUNDOPLASTY W/FUNDIC PATCH NISSEN PROCEDURE        2006 2010   LIGAMENTOUS RECONSTRUCTION KNEE        2004          Allergies  Allergen  Reactions   Codeine Nausea And Vomiting            Current Outpatient Medications on File Prior to Visit  Medication Sig Dispense Refill   cetirizine  (ZYRTEC ) 10 MG tablet Take 10 mg by mouth once daily       esomeprazole (NEXIUM) 20 MG DR capsule Take by mouth        No current facility-administered medications on file prior to visit.      History reviewed. No pertinent family history.    Social History       Tobacco Use  Smoking Status Never  Smokeless Tobacco Never      Social History         Socioeconomic History   Marital status: Married  Tobacco Use   Smoking status: Never   Smokeless tobacco: Never  Vaping Use   Vaping status: Never Used  Substance and Sexual Activity   Alcohol use: Yes      Comment: 2x a week   Drug use: Never      Objective:     BP (!) 149/109   Pulse (!) 110   Temp 98.4 F (36.9 C) (Oral)   Resp 18   Ht 6' (1.829 m)   Wt 83.9 kg   SpO2 98%   BMI  25.09 kg/m   Body mass index is 30.33 kg/m.   Physical Exam Constitutional:      General: He is not in acute distress.    Appearance: Normal appearance.  HENT:     Head: Normocephalic.     Nose: No rhinorrhea.     Mouth/Throat:     Mouth: Mucous membranes are moist.     Pharynx: Oropharynx is clear.  Eyes:     General: No scleral icterus.    Pupils: Pupils are equal, round, and reactive to light.  Cardiovascular:     Rate and Rhythm: Normal rate.     Pulses: Normal pulses.  Pulmonary:     Effort: Pulmonary effort is normal. No respiratory distress.     Breath sounds: No stridor. No wheezing.  Abdominal:     General: Abdomen is flat. There is no distension.     Tenderness: There is no abdominal tenderness. There is no guarding or rebound.     Hernia: A hernia is present. Hernia is present in the ventral area.     Musculoskeletal:        General: Normal range of motion.     Cervical back: Normal range of motion and neck supple.  Skin:    General: Skin is warm and dry.      Capillary Refill: Capillary refill takes less than 2 seconds.     Coloration: Skin is not jaundiced.  Neurological:     General: No focal deficit present.     Mental Status: He is alert and oriented to person, place, and time. Mental status is at baseline.  Psychiatric:        Mood and Affect: Mood normal.        Thought Content: Thought content normal.        Judgment: Judgment normal.        Hernia Size:1cm Incarcerated: No Initial Hernia     Assessment and Plan:  Diagnoses and all orders for this visit:   Ventral hernia without obstruction or gangrene     Chad Singh is a 48 y.o. male     We will proceed to the OR for a lap ventral hernia repair with mesh. All risks and benefits were discussed with the patient, to generally include infection, bleeding, damage to surrounding structures, acute and chronic nerve pain, and recurrence. Alternatives were offered and described.  All questions were answered and the patient voiced understanding of the procedure and wishes to proceed at this point.

## 2024-03-05 NOTE — Discharge Instructions (Signed)

## 2024-03-05 NOTE — Op Note (Signed)
 03/05/2024  11:50 AM  PATIENT:  Chad Singh  48 y.o. male  PRE-OPERATIVE DIAGNOSIS:  VENTRAL HERNIA  POST-OPERATIVE DIAGNOSIS:  VENTRAL HERNIA. 2 cm hernia  PROCEDURE:  Procedure(s): LAPAROSCOPIC VENTRAL HERNIA REPAIR WITH MESH (N/A)  SURGEON:  Surgeons and Role:    DEWAINE Rubin Calamity, MD - Primary  ASSISTANTS: none   ANESTHESIA:   local and general  EBL:  minimal   BLOOD ADMINISTERED:none  DRAINS: none   LOCAL MEDICATIONS USED:  BUPIVICAINE   SPECIMEN:  No Specimen  DISPOSITION OF SPECIMEN:  N/A  COUNTS:  YES  TOURNIQUET:  * No tourniquets in log *  DICTATION: .Dragon Dictation  Findings: 2cm hernia  Details of the procedure:  After the patient was consented patient was taken back to the operating room patient was then placed in supine position bilateral SCDs in place.  The patient was prepped and draped in the usual sterile fashion. After antibiotics were confirmed a timeout was called and all facts were verified. The Veress needle technique was used to insuflate the abdomen at Palmer's point. The abdomen was insufflated to 14 mm mercury. Subsequently a 5 mm trocar was placed a camera inserted there was no injury to any intra-abdominal organs.    There was seen to be a non-incarcerated 2cm ventral hernia.  A second camera port was in placed into the left lower quadrant.   At this the Falicform ligament was taken down with Bovie cautery maintaining hemostasis.  I proceeded to reduce the hernia contents.  The hernia sac was dissected out of the hernia and disposed.  The fascia at the hernia was reapproximated using a #0 Ethibonds x 1.  Once the hernia was cleared away, a Bard Ventralight 10x15cm  mesh was inserted into the abdomen.  The mesh was secured circumferentially with am Securestrap tacker in a double crown fashion.    The omentum was brought over the area of the mesh. The fascia at the left lower quadrant port site was reapproximated with a 0 vicryl and  an endoclose device.  The pneumoperitoneum was evacuated & all trocars  were removed. The skin was reapproximated with 4-0  Monocryl sutures in a subcuticular fashion. The skin was dressed with Dermabond.  The patient was taken to the recovery room in stable condition.  Type of repair -primary suture & mesh  Mesh overlap - 5cm  Placement of mesh -  beneath fascia and into peritoneal cavity  Size: 3cm, Primary Hernia, and Reducible Hernia   PLAN OF CARE: Discharge to home after PACU  PATIENT DISPOSITION:  PACU - hemodynamically stable.   Delay start of Pharmacological VTE agent (>24hrs) due to surgical blood loss or risk of bleeding: not applicable

## 2024-03-06 ENCOUNTER — Encounter (HOSPITAL_COMMUNITY): Payer: Self-pay | Admitting: General Surgery
